# Patient Record
Sex: Female | Born: 1955 | Race: White | Hispanic: No | Smoking: Former smoker
Health system: Southern US, Community
[De-identification: ages and names within clinical notes are randomized; demographics above are authoritative.]

## PROBLEM LIST (undated history)

## (undated) DIAGNOSIS — F419 Anxiety disorder, unspecified: Secondary | ICD-10-CM

## (undated) DIAGNOSIS — M1990 Unspecified osteoarthritis, unspecified site: Secondary | ICD-10-CM

## (undated) HISTORY — PX: BREAST EXCISIONAL BIOPSY: SUR124

## (undated) HISTORY — DX: Anxiety disorder, unspecified: F41.9

---

## 1979-06-03 HISTORY — PX: BREAST BIOPSY: SHX20

## 2001-09-23 ENCOUNTER — Other Ambulatory Visit: Admission: RE | Admit: 2001-09-23 | Discharge: 2001-09-23 | Payer: Self-pay | Admitting: Internal Medicine

## 2002-10-07 HISTORY — PX: ABDOMINAL HYSTERECTOMY: SHX81

## 2004-04-23 ENCOUNTER — Other Ambulatory Visit: Payer: Self-pay

## 2004-07-14 ENCOUNTER — Ambulatory Visit: Payer: Self-pay

## 2005-05-01 ENCOUNTER — Ambulatory Visit: Payer: Self-pay

## 2005-05-06 ENCOUNTER — Ambulatory Visit: Payer: Self-pay | Admitting: Internal Medicine

## 2005-07-20 ENCOUNTER — Ambulatory Visit: Payer: Self-pay

## 2005-07-31 ENCOUNTER — Ambulatory Visit: Payer: Self-pay

## 2005-10-28 ENCOUNTER — Emergency Department: Payer: Self-pay | Admitting: Internal Medicine

## 2005-11-03 ENCOUNTER — Ambulatory Visit: Payer: Self-pay | Admitting: Urology

## 2006-01-15 ENCOUNTER — Ambulatory Visit: Payer: Self-pay | Admitting: General Surgery

## 2006-05-16 ENCOUNTER — Ambulatory Visit: Payer: Self-pay

## 2006-05-29 ENCOUNTER — Ambulatory Visit: Payer: Self-pay | Admitting: Urology

## 2006-06-20 ENCOUNTER — Ambulatory Visit: Payer: Self-pay | Admitting: Unknown Physician Specialty

## 2006-06-29 ENCOUNTER — Ambulatory Visit: Payer: Self-pay | Admitting: Unknown Physician Specialty

## 2006-07-19 ENCOUNTER — Ambulatory Visit: Payer: Self-pay | Admitting: Unknown Physician Specialty

## 2006-07-24 ENCOUNTER — Ambulatory Visit: Payer: Self-pay | Admitting: General Surgery

## 2006-07-30 ENCOUNTER — Emergency Department: Payer: Self-pay | Admitting: Unknown Physician Specialty

## 2006-08-03 ENCOUNTER — Ambulatory Visit: Payer: Self-pay | Admitting: Unknown Physician Specialty

## 2006-12-14 ENCOUNTER — Ambulatory Visit: Payer: Self-pay | Admitting: Unknown Physician Specialty

## 2007-04-23 ENCOUNTER — Emergency Department: Payer: Self-pay | Admitting: Emergency Medicine

## 2007-07-19 DIAGNOSIS — N209 Urinary calculus, unspecified: Secondary | ICD-10-CM | POA: Insufficient documentation

## 2007-09-03 ENCOUNTER — Ambulatory Visit: Payer: Self-pay | Admitting: General Surgery

## 2007-09-10 ENCOUNTER — Ambulatory Visit: Payer: Self-pay | Admitting: Urology

## 2008-04-10 ENCOUNTER — Ambulatory Visit: Payer: Self-pay | Admitting: Unknown Physician Specialty

## 2008-09-04 ENCOUNTER — Ambulatory Visit: Payer: Self-pay | Admitting: General Surgery

## 2009-09-07 ENCOUNTER — Ambulatory Visit: Payer: Self-pay | Admitting: General Surgery

## 2010-06-28 LAB — HM PAP SMEAR: HM PAP: NEGATIVE

## 2010-06-29 ENCOUNTER — Ambulatory Visit: Payer: Self-pay | Admitting: Family Medicine

## 2010-11-16 ENCOUNTER — Ambulatory Visit: Payer: Self-pay

## 2011-07-13 ENCOUNTER — Ambulatory Visit: Payer: Self-pay | Admitting: Family Medicine

## 2011-07-27 LAB — LIPID PANEL
CHOLESTEROL: 215 mg/dL — AB (ref 0–200)
HDL: 72 mg/dL — AB (ref 35–70)
LDL Cholesterol: 127 mg/dL
Triglycerides: 80 mg/dL (ref 40–160)

## 2012-07-17 ENCOUNTER — Ambulatory Visit: Payer: Self-pay | Admitting: Family Medicine

## 2013-07-10 ENCOUNTER — Ambulatory Visit: Payer: Self-pay | Admitting: Family Medicine

## 2013-12-02 ENCOUNTER — Other Ambulatory Visit: Payer: Self-pay | Admitting: Family Medicine

## 2013-12-02 LAB — CBC WITH DIFFERENTIAL/PLATELET
Basophil #: 0.1 10*3/uL (ref 0.0–0.1)
Basophil %: 1.1 %
EOS ABS: 0.6 10*3/uL (ref 0.0–0.7)
Eosinophil %: 7 %
HCT: 40.5 % (ref 35.0–47.0)
HGB: 13.6 g/dL (ref 12.0–16.0)
Lymphocyte #: 2.3 10*3/uL (ref 1.0–3.6)
Lymphocyte %: 28.1 %
MCH: 32.2 pg (ref 26.0–34.0)
MCHC: 33.5 g/dL (ref 32.0–36.0)
MCV: 96 fL (ref 80–100)
MONOS PCT: 6.2 %
Monocyte #: 0.5 x10 3/mm (ref 0.2–0.9)
NEUTROS ABS: 4.8 10*3/uL (ref 1.4–6.5)
Neutrophil %: 57.6 %
Platelet: 377 10*3/uL (ref 150–440)
RBC: 4.22 10*6/uL (ref 3.80–5.20)
RDW: 13.2 % (ref 11.5–14.5)
WBC: 8.3 10*3/uL (ref 3.6–11.0)

## 2013-12-02 LAB — LIPID PANEL
Cholesterol: 200 mg/dL (ref 0–200)
HDL: 72 mg/dL — AB (ref 40–60)
Ldl Cholesterol, Calc: 113 mg/dL — ABNORMAL HIGH (ref 0–100)
Triglycerides: 76 mg/dL (ref 0–200)
VLDL CHOLESTEROL, CALC: 15 mg/dL (ref 5–40)

## 2013-12-02 LAB — COMPREHENSIVE METABOLIC PANEL
ALK PHOS: 94 U/L
AST: 26 U/L (ref 15–37)
Albumin: 3.9 g/dL (ref 3.4–5.0)
Anion Gap: 6 — ABNORMAL LOW (ref 7–16)
BILIRUBIN TOTAL: 0.5 mg/dL (ref 0.2–1.0)
BUN: 15 mg/dL (ref 7–18)
Calcium, Total: 9 mg/dL (ref 8.5–10.1)
Chloride: 102 mmol/L (ref 98–107)
Co2: 27 mmol/L (ref 21–32)
Creatinine: 0.66 mg/dL (ref 0.60–1.30)
Glucose: 83 mg/dL (ref 65–99)
Osmolality: 270 (ref 275–301)
Potassium: 4 mmol/L (ref 3.5–5.1)
SGPT (ALT): 27 U/L (ref 12–78)
Sodium: 135 mmol/L — ABNORMAL LOW (ref 136–145)
TOTAL PROTEIN: 7.7 g/dL (ref 6.4–8.2)

## 2014-07-21 ENCOUNTER — Ambulatory Visit: Payer: Self-pay | Admitting: Family Medicine

## 2014-08-05 ENCOUNTER — Ambulatory Visit: Payer: Self-pay | Admitting: Family Medicine

## 2014-08-05 LAB — HM MAMMOGRAPHY

## 2014-09-12 ENCOUNTER — Emergency Department: Payer: Self-pay | Admitting: Emergency Medicine

## 2014-09-12 LAB — CBC WITH DIFFERENTIAL/PLATELET
BASOS PCT: 1 %
Basophil #: 0.1 10*3/uL (ref 0.0–0.1)
EOS PCT: 4 %
Eosinophil #: 0.4 10*3/uL (ref 0.0–0.7)
HCT: 43.5 % (ref 35.0–47.0)
HGB: 14 g/dL (ref 12.0–16.0)
Lymphocyte #: 2.9 10*3/uL (ref 1.0–3.6)
Lymphocyte %: 27 %
MCH: 31.9 pg (ref 26.0–34.0)
MCHC: 32.2 g/dL (ref 32.0–36.0)
MCV: 99 fL (ref 80–100)
Monocyte #: 0.6 x10 3/mm (ref 0.2–0.9)
Monocyte %: 5.5 %
Neutrophil #: 6.6 10*3/uL — ABNORMAL HIGH (ref 1.4–6.5)
Neutrophil %: 62.5 %
Platelet: 406 10*3/uL (ref 150–440)
RBC: 4.39 10*6/uL (ref 3.80–5.20)
RDW: 13.1 % (ref 11.5–14.5)
WBC: 10.7 10*3/uL (ref 3.6–11.0)

## 2014-09-12 LAB — COMPREHENSIVE METABOLIC PANEL
ALBUMIN: 3.7 g/dL (ref 3.4–5.0)
ALK PHOS: 108 U/L
AST: 24 U/L (ref 15–37)
Anion Gap: 8 (ref 7–16)
BUN: 7 mg/dL (ref 7–18)
Bilirubin,Total: 0.3 mg/dL (ref 0.2–1.0)
Calcium, Total: 9 mg/dL (ref 8.5–10.1)
Chloride: 107 mmol/L (ref 98–107)
Co2: 25 mmol/L (ref 21–32)
Creatinine: 0.66 mg/dL (ref 0.60–1.30)
EGFR (African American): >60
Glucose: 100 mg/dL — ABNORMAL HIGH (ref 65–99)
Osmolality: 277 (ref 275–301)
POTASSIUM: 3.7 mmol/L (ref 3.5–5.1)
SGPT (ALT): 26 U/L
SODIUM: 140 mmol/L (ref 136–145)
Total Protein: 7.9 g/dL (ref 6.4–8.2)

## 2014-09-12 LAB — URINALYSIS, COMPLETE
Bacteria: NONE SEEN
Bilirubin,UR: NEGATIVE
GLUCOSE, UR: NEGATIVE mg/dL (ref 0–75)
Ketone: NEGATIVE
NITRITE: NEGATIVE
PROTEIN: NEGATIVE
Ph: 6 (ref 4.5–8.0)
RBC,UR: 1 /HPF (ref 0–5)
Specific Gravity: 1.001 (ref 1.003–1.030)
Squamous Epithelial: 1

## 2014-09-12 LAB — LIPASE, BLOOD: LIPASE: 167 U/L (ref 73–393)

## 2014-09-12 LAB — TROPONIN I: Troponin-I: <0.02 ng/mL

## 2014-11-06 LAB — BASIC METABOLIC PANEL
BUN: 12 mg/dL (ref 4–21)
CREATININE: 0.7 mg/dL (ref 0.5–1.1)
Glucose: 90 mg/dL
Potassium: 4.2 mmol/L (ref 3.4–5.3)
Sodium: 136 mmol/L — AB (ref 137–147)

## 2014-11-06 LAB — CBC AND DIFFERENTIAL
HCT: 42 % (ref 36–46)
Hemoglobin: 13.6 g/dL (ref 12.0–16.0)
PLATELETS: 435 10*3/uL — AB (ref 150–399)
WBC: 8.2 10*3/mL

## 2014-11-06 LAB — HEPATIC FUNCTION PANEL
ALT: 15 U/L (ref 7–35)
AST: 19 U/L (ref 13–35)

## 2014-11-06 LAB — TSH: TSH: 1.73 u[IU]/mL (ref 0.41–5.90)

## 2014-11-13 ENCOUNTER — Ambulatory Visit: Payer: Self-pay | Admitting: Gastroenterology

## 2014-11-13 LAB — HM COLONOSCOPY

## 2015-04-24 ENCOUNTER — Other Ambulatory Visit: Payer: Self-pay | Admitting: Family Medicine

## 2015-04-24 DIAGNOSIS — F419 Anxiety disorder, unspecified: Secondary | ICD-10-CM

## 2015-04-26 DIAGNOSIS — F419 Anxiety disorder, unspecified: Secondary | ICD-10-CM | POA: Insufficient documentation

## 2015-04-26 NOTE — Telephone Encounter (Signed)
Last OV 01/2015 

## 2015-05-21 ENCOUNTER — Telehealth: Payer: Self-pay | Admitting: Family Medicine

## 2015-05-21 ENCOUNTER — Other Ambulatory Visit: Payer: Self-pay | Admitting: Family Medicine

## 2015-05-21 DIAGNOSIS — F419 Anxiety disorder, unspecified: Secondary | ICD-10-CM

## 2015-05-21 NOTE — Telephone Encounter (Signed)
Pt is a Dr. Santiago Bur pt but needs a refill on her medicine , [facility] CB# [phone] CC  ALPRAZolam (XANAX) 0.5 MG tablet

## 2015-05-21 NOTE — Telephone Encounter (Signed)
[facility], [facility] has 1 refill left.

## 2015-05-21 NOTE — Telephone Encounter (Signed)
Spoke with pt regarding wanting a new prescription refill for xanax, per previus phone call notes, pharmacy has stated that pt has one more refill.  Pt notified of the prescription refill already in place and was advised to call if any questions or concerns.

## 2015-06-04 ENCOUNTER — Other Ambulatory Visit: Payer: Self-pay | Admitting: Family Medicine

## 2015-06-04 DIAGNOSIS — F419 Anxiety disorder, unspecified: Secondary | ICD-10-CM

## 2015-06-04 MED ORDER — FLUOXETINE HCL 40 MG PO CAPS: 40.0000 mg | ORAL_CAPSULE | Freq: Every day | ORAL | Status: DC

## 2015-06-04 NOTE — Telephone Encounter (Signed)
Pt contacted office for refill request on the following medications: Prozac 40 mg to [facility].  This was a Debbie pt. Pt stated the pharmacy sent a refill request to [place] and it was denied. I didn't see the request. (maybe b/c they might have tried to send it to [facility]) Pt stated when she had an OV with Dr. Elease Hashimoto on 02/11/15 the medication was discussed and she doesn't know why we are only sending in refills one month at a time. Pt stated that when she got her other RX she was told there was a note that she needs an OV. Pt was wondering why she needs an OV when she was in for one in May and the meds were discussed. Please advise. Thanks TNP

## 2015-06-17 ENCOUNTER — Other Ambulatory Visit: Payer: Self-pay | Admitting: Family Medicine

## 2015-06-17 DIAGNOSIS — F419 Anxiety disorder, unspecified: Secondary | ICD-10-CM

## 2015-06-17 MED ORDER — ALPRAZOLAM 0.5 MG PO TABS: 0.5000 mg | ORAL_TABLET | Freq: Three times a day (TID) | ORAL | Status: DC | PRN

## 2015-06-17 NOTE — Telephone Encounter (Signed)
Ok to phone in rx. Thanks.  

## 2015-06-17 NOTE — Telephone Encounter (Signed)
Pt contacted office for refill request on the following medications: ALPRAZolam (XANAX) 0.5 MG tablet to [facility]. Pt asked for more than one refill. Pt scheduled her CPE for 11/10/15. Last OV 02/11/15. Thanks TNP

## 2015-07-27 ENCOUNTER — Telehealth: Payer: Self-pay

## 2015-07-27 DIAGNOSIS — R921 Mammographic calcification found on diagnostic imaging of breast: Secondary | ICD-10-CM | POA: Insufficient documentation

## 2015-07-27 NOTE — Telephone Encounter (Signed)
Mammo from 08/2014 found breast calcifications in left breast. Was supposed to have FU diagnostic bilateral mammo in 6 months. Please sign. Allene DillonEmily Drozdowski, CMA

## 2015-07-27 NOTE — Telephone Encounter (Signed)
Pt states that she needs mammogram set up and they told her she needs order from us because her last one was not normal and she forgot to follow up on this. Please review-aa

## 2015-07-28 ENCOUNTER — Telehealth: Payer: Self-pay

## 2015-07-28 NOTE — Telephone Encounter (Signed)
Pt advised of appt scheduled for 08/13/2015 at 9:20 am. sd

## 2015-07-28 NOTE — Telephone Encounter (Signed)
Dr. Elease HashimotoMaloney have you signed the order for the diagnostic mammo. Pt called back requesting diagnostic mammogram, per pt to schedule around lunch time or late in afternoon. sd

## 2015-07-28 NOTE — Telephone Encounter (Signed)
Patient is returning call. Thanks. °

## 2015-07-28 NOTE — Telephone Encounter (Signed)
Ok to put in order for diagnostic mammo, but do think I already signed it yesterday.

## 2015-08-09 ENCOUNTER — Other Ambulatory Visit: Payer: Self-pay | Admitting: Family Medicine

## 2015-08-09 DIAGNOSIS — F419 Anxiety disorder, unspecified: Secondary | ICD-10-CM

## 2015-08-09 MED ORDER — ALPRAZOLAM 0.5 MG PO TABS: 0.5000 mg | ORAL_TABLET | Freq: Three times a day (TID) | ORAL | Status: DC | PRN

## 2015-08-09 NOTE — Telephone Encounter (Signed)
Printed, please fax or call in to pharmacy. Thank you.   

## 2015-08-09 NOTE — Telephone Encounter (Signed)
Pt needs refill on her ALPRAZolam Prudy Feeler(XANAX) 0.5 MG tablet   [facility] [place]  Call back is [phone]  Thanks Barth Kirkseri

## 2015-08-13 ENCOUNTER — Ambulatory Visit
Admission: RE | Admit: 2015-08-13 | Discharge: 2015-08-13 | Disposition: A | Discharge status: Home / Self Care | Payer: BLUE CROSS/BLUE SHIELD | Source: Ambulatory Visit | Attending: Family Medicine | Admitting: Family Medicine

## 2015-08-13 ENCOUNTER — Other Ambulatory Visit: Payer: Self-pay | Admitting: Family Medicine

## 2015-08-13 DIAGNOSIS — R921 Mammographic calcification found on diagnostic imaging of breast: Secondary | ICD-10-CM

## 2015-10-25 DIAGNOSIS — F329 Major depressive disorder, single episode, unspecified: Secondary | ICD-10-CM | POA: Insufficient documentation

## 2015-10-25 DIAGNOSIS — R319 Hematuria, unspecified: Secondary | ICD-10-CM | POA: Insufficient documentation

## 2015-10-25 DIAGNOSIS — F32A Depression, unspecified: Secondary | ICD-10-CM | POA: Insufficient documentation

## 2015-10-25 DIAGNOSIS — J309 Allergic rhinitis, unspecified: Secondary | ICD-10-CM | POA: Insufficient documentation

## 2015-10-25 DIAGNOSIS — J45909 Unspecified asthma, uncomplicated: Secondary | ICD-10-CM | POA: Insufficient documentation

## 2015-10-25 DIAGNOSIS — E559 Vitamin D deficiency, unspecified: Secondary | ICD-10-CM | POA: Insufficient documentation

## 2015-11-10 ENCOUNTER — Encounter: Payer: Self-pay | Admitting: Family Medicine

## 2015-11-17 ENCOUNTER — Other Ambulatory Visit: Payer: Self-pay | Admitting: Family Medicine

## 2015-11-17 DIAGNOSIS — F419 Anxiety disorder, unspecified: Secondary | ICD-10-CM

## 2015-11-17 MED ORDER — ALPRAZOLAM 0.5 MG PO TABS: 0.5000 mg | ORAL_TABLET | Freq: Three times a day (TID) | ORAL | Status: DC | PRN

## 2015-11-17 NOTE — Telephone Encounter (Signed)
Printed, please fax or call in to pharmacy. Also needs ov.  Thank you.

## 2015-11-17 NOTE — Telephone Encounter (Signed)
02/11/15 last ov

## 2015-11-17 NOTE — Telephone Encounter (Signed)
Pt contacted office for refill request on the following medications:  ALPRAZolam (XANAX) 0.5 MG tablet.  [facility] [place]  CB#[phone]/MW °

## 2015-11-17 NOTE — Telephone Encounter (Signed)
[facility] Patient is aware. Patient states she will call back tomorrow to schedule a follow up.

## 2016-01-05 ENCOUNTER — Other Ambulatory Visit: Payer: Self-pay | Admitting: Family Medicine

## 2016-01-05 DIAGNOSIS — F419 Anxiety disorder, unspecified: Secondary | ICD-10-CM

## 2016-01-05 MED ORDER — ALPRAZOLAM 0.5 MG PO TABS: 0.5000 mg | ORAL_TABLET | Freq: Three times a day (TID) | ORAL | Status: DC | PRN

## 2016-01-05 NOTE — Telephone Encounter (Signed)
Has CPE scheduled for 02/18/2016. Last refill 11/17/2015. Allene DillonEmily Drozdowski, CMA

## 2016-01-05 NOTE — Telephone Encounter (Signed)
Pt needs refill on her ALPRAZolam Prudy Feeler(XANAX) 0.5 MG tablet  [facility] [place]   Her call back is [phone]  [employer]anks[employer]eri

## 2016-01-05 NOTE — Telephone Encounter (Signed)
[facility].Attempted to contact patient. No answer and voicemail was full

## 2016-01-05 NOTE — Telephone Encounter (Signed)
Printed, please fax or call in to pharmacy. Thank you.   

## 2016-01-27 ENCOUNTER — Ambulatory Visit: Payer: Self-pay | Admitting: Physician Assistant

## 2016-01-27 ENCOUNTER — Telehealth: Payer: Self-pay | Admitting: Family Medicine

## 2016-01-27 DIAGNOSIS — K219 Gastro-esophageal reflux disease without esophagitis: Secondary | ICD-10-CM

## 2016-01-27 MED ORDER — ESOMEPRAZOLE MAGNESIUM 20 MG PO CPDR: 20.0000 mg | DELAYED_RELEASE_CAPSULE | Freq: Two times a day (BID) | ORAL | Status: DC | PRN

## 2016-01-27 NOTE — Telephone Encounter (Signed)
Is this okay to do.  Pt has never been seen for reflux, at least recently.  (I don't see it in her problem list in Epic or Allscripts.)  Thanks,   -Vernona RiegerLaura

## 2016-01-27 NOTE — Telephone Encounter (Signed)
Pt needs RX for nexium.  She has been purchasing them OTC.  Patient said she takes them for reflux. gerd  [facility] [place] [facility] she ask for a rx so it would not be as costly.  Pt 's call back is [phone]  [facility]

## 2016-01-27 NOTE — Telephone Encounter (Signed)
Pt was seen for this in February, 2016. Was referred to GI and started on PPI for abdominal pain. Pt has been buying this OTC, which is becoming pricey. Per verbal order from Dr. Elease HashimotoMaloney, sent in rx to pharmacy. Allene DillonEmily Drozdowski, CMA

## 2016-01-27 NOTE — Telephone Encounter (Signed)
Needs ov if we have not treated her for this before I can send in rx to document need. Ok to put in same day if needed.  Thanks.

## 2016-02-02 ENCOUNTER — Other Ambulatory Visit: Payer: Self-pay | Admitting: Family Medicine

## 2016-02-02 DIAGNOSIS — F419 Anxiety disorder, unspecified: Secondary | ICD-10-CM

## 2016-02-02 MED ORDER — ALPRAZOLAM 0.5 MG PO TABS: 0.5000 mg | ORAL_TABLET | Freq: Three times a day (TID) | ORAL | Status: DC | PRN

## 2016-02-02 NOTE — Telephone Encounter (Signed)
Pt contacted office for refill request on the following medications:  ALPRAZolam (XANAX) 0.5 MG tablet.  Pt is requesting a 6 month supply.  Pt has her CPE on 02/17/2014. [facility] [place]  CB#[phone]/MW

## 2016-02-02 NOTE — Telephone Encounter (Signed)
Printed, please fax or call in to pharmacy. Thank you.   

## 2016-02-02 NOTE — Telephone Encounter (Signed)
Pt needs to be seen before she gets a six month refill.  She has not been here since 01/2015 and has had one "No Show" with Antony ContrasJenni.   Thanks,   -Vernona RiegerLaura

## 2016-02-18 ENCOUNTER — Encounter: Payer: Self-pay | Admitting: Family Medicine

## 2016-02-18 ENCOUNTER — Ambulatory Visit (INDEPENDENT_AMBULATORY_CARE_PROVIDER_SITE_OTHER): Payer: BLUE CROSS/BLUE SHIELD | Admitting: Family Medicine

## 2016-02-18 VITALS — BP 120/70 | HR 88 | Temp 98.2°F | Resp 16 | Ht 67.0 in | Wt 222.0 lb

## 2016-02-18 DIAGNOSIS — F32A Depression, unspecified: Secondary | ICD-10-CM

## 2016-02-18 DIAGNOSIS — Z0000 Encounter for general adult medical examination without abnormal findings: Secondary | ICD-10-CM

## 2016-02-18 DIAGNOSIS — F329 Major depressive disorder, single episode, unspecified: Secondary | ICD-10-CM

## 2016-02-18 DIAGNOSIS — K219 Gastro-esophageal reflux disease without esophagitis: Secondary | ICD-10-CM | POA: Diagnosis not present

## 2016-02-18 DIAGNOSIS — R319 Hematuria, unspecified: Secondary | ICD-10-CM

## 2016-02-18 DIAGNOSIS — R5383 Other fatigue: Secondary | ICD-10-CM | POA: Diagnosis not present

## 2016-02-18 DIAGNOSIS — R635 Abnormal weight gain: Secondary | ICD-10-CM

## 2016-02-18 DIAGNOSIS — F419 Anxiety disorder, unspecified: Secondary | ICD-10-CM | POA: Diagnosis not present

## 2016-02-18 LAB — POCT URINALYSIS DIPSTICK
Bilirubin, UA: NEGATIVE
Glucose, UA: NEGATIVE
Ketones, UA: NEGATIVE
Nitrite, UA: NEGATIVE
PROTEIN UA: NEGATIVE
SPEC GRAV UA: 1.015
UROBILINOGEN UA: 0.2
pH, UA: 6

## 2016-02-18 MED ORDER — ALPRAZOLAM 0.5 MG PO TABS: 0.5000 mg | ORAL_TABLET | Freq: Three times a day (TID) | ORAL | Status: DC | PRN

## 2016-02-18 NOTE — Progress Notes (Signed)
Patient ID: Marie Foley, female   DOB: Dec 13, 1955, 60 y.o.   MRN: [id number]       Patient: Marie Foley, Female    DOB: 04-17-1956, 60 y.o.   MRN: [id number] Visit Date: 02/18/2016  Today's Provider: Margarita Rana, MD   Chief Complaint  Patient presents with  . Annual Exam   Subjective:    Annual physical exam Marie Foley is a 60 y.o. female who presents today for health maintenance and complete physical. She feels fairly well. She reports exercising none. She reports she is sleeping well.  11/06/14 CPE 06/28/10 Pap-neg 08/13/15 Mammogram-BI-RADS 3 11/13/14 Colonoscopy-polyps; tubular adenoma recheck in 5 years, Dr. Allen Norris -----------------------------------------------------------------  Fatigue: Patient complains of fatigue. Symptoms began several months ago. Sentinal symptom the patient feels fatigue began with: none. Symptoms of her fatigue have been feelings of depression. Patient describes the following psychologic symptoms: depression.  Patient denies fever. Symptoms have stabilized. Severity has been struggles to carry out day to day responsibilities.. Previous visits for this problem: none.    Review of Systems  Constitutional: Positive for fatigue.  HENT: Negative.   Eyes: Negative.   Respiratory: Negative.   Cardiovascular: Negative.   Gastrointestinal: Positive for abdominal distention.  Endocrine: Negative.   Genitourinary: Negative.   Musculoskeletal: Negative.   Skin: Negative.   Allergic/Immunologic: Negative.   Neurological: Negative.   Hematological: Negative.   Psychiatric/Behavioral: Negative.     Social History      She  reports that she quit smoking about 17 years ago. She has never used smokeless tobacco. She reports that she drinks alcohol. She reports that she does not use illicit drugs.       Social History   Social History  . Marital Status: Widowed    Spouse Name: N/A  . Number of Children: N/A  . Years of Education: N/A    Social History Main Topics  . Smoking status: Former Smoker    Quit date: 10/01/1998  . Smokeless tobacco: Never Used  . Alcohol Use: Yes     Comment: ocassional  . Drug Use: No  . Sexual Activity: Not Asked   Other Topics Concern  . None   Social History Narrative    History reviewed. No pertinent past medical history.   Patient Active Problem List   Diagnosis Date Noted  . Allergic rhinitis 10/25/2015  . Clinical depression 10/25/2015  . Blood in the urine 10/25/2015  . RAD (reactive airway disease) 10/25/2015  . Avitaminosis D 10/25/2015  . Calcification of left breast 07/27/2015  . Anxiety disorder 04/26/2015  . Urinary calculus 07/19/2007    Past Surgical History  Procedure Laterality Date  . Breast biopsy Left 1980's    EXCISIONAL BX - NEG  . Abdominal hysterectomy  10/07/2002    Dr. Laurey Morale; due to hemorrhaging    Family History        Family Status  Relation Status Death Age  . Mother Deceased 44    ectopic pregnancy  . Father Deceased 27  . Sister Alive   . Brother Alive   . Sister Alive   . Sister Alive   . Brother Alive         Her family history includes Alzheimer's disease in her father; Breast cancer in her paternal grandmother; Depression in her father, sister, sister, and sister; Early death in her mother.    Allergies  Allergen Reactions  . Diclofenac-Misoprostol     Previous Medications   ALPRAZOLAM (XANAX) 0.5 MG  TABLET    Take 1 tablet (0.5 mg total) by mouth every 8 (eight) hours as needed.   ESOMEPRAZOLE (NEXIUM) 20 MG CAPSULE    Take 1 capsule (20 mg total) by mouth 2 (two) times daily as needed.   FLUOXETINE (PROZAC) 40 MG CAPSULE    Take 1 capsule (40 mg total) by mouth daily.   FLUTICASONE (FLONASE) 50 MCG/ACT NASAL SPRAY    Place 2 sprays into the nose as needed.    FLUTICASONE-SALMETEROL (ADVAIR DISKUS) 250-50 MCG/DOSE AEPB    Inhale 1 puff into the lungs as needed.     Patient Care Team: Margarita Rana, MD as PCP -  General (Family Medicine)     Objective:   Vitals: BP 120/70 mmHg  Pulse 88  Temp(Src) 98.2 F (36.8 C) (Oral)  Resp 16  Ht '5\' 7"'  (1.702 m)  Wt 222 lb (100.699 kg)  BMI 34.76 kg/m2   Physical Exam  Constitutional: She is oriented to person, place, and time. She appears well-developed and well-nourished.  HENT:  Head: Normocephalic and atraumatic.  Right Ear: Tympanic membrane, external ear and ear canal normal.  Left Ear: Tympanic membrane, external ear and ear canal normal.  Nose: Nose normal.  Mouth/Throat: Uvula is midline, oropharynx is clear and moist and mucous membranes are normal.  Eyes: Conjunctivae, EOM and lids are normal. Pupils are equal, round, and reactive to light.  Neck: Trachea normal and normal range of motion. Neck supple. Carotid bruit is not present. No thyroid mass and no thyromegaly present.  Cardiovascular: Normal rate, regular rhythm and normal heart sounds.   Pulmonary/Chest: Effort normal and breath sounds normal.  Abdominal: Soft. Normal appearance and bowel sounds are normal. There is no hepatosplenomegaly. There is no tenderness.  Genitourinary: No breast swelling, tenderness or discharge.  Musculoskeletal: Normal range of motion.  Lymphadenopathy:    She has no cervical adenopathy.    She has no axillary adenopathy.  Neurological: She is alert and oriented to person, place, and time. She has normal strength. No cranial nerve deficit.  Skin: Skin is warm, dry and intact.  Psychiatric: She has a normal mood and affect. Her speech is normal and behavior is normal. Judgment and thought content normal. Cognition and memory are normal.     Depression Screen PHQ 2/9 Scores 02/18/2016  PHQ - 2 Score 2  PHQ- 9 Score 6     Assessment & Plan:     Routine Health Maintenance and Physical Exam  Exercise Activities and Dietary recommendations Goals    . Exercise 150 minutes per week (moderate activity)       Immunization History  Administered  Date(s) Administered  . Tdap 10/15/2013       1. Annual physical exam Stable. Patient advised to continue eating healthy and exercise daily. - POCT urinalysis dipstick  2. Other fatigue New problem. Worsening. F/U pending lab report. - CBC with Differential/Platelet - Comprehensive metabolic panel  3. Anxiety disorder, unspecified anxiety disorder type Stable.  4. Clinical depression Stable.  5. Abnormal weight gain New problem. F/U pending lab report. Recommend increase exercise. Has really gotten out of the habit since the death of her spouse 4 years ago.  Met Tawanna Sat today also and will follow up with her in 4 months.   - Lipid Panel With LDL/HDL Ratio - TSH  6. Blood in the urine F/U pending lab report. - Urine Microscopic   Patient seen and examined by Dr. Jerrell Belfast, and note scribed by Madison Hickman  Cleaster Corin, CMA.  I have reviewed the document for accuracy and completeness and I agree with above. Jerrell Belfast, MD   Margarita Rana, MD   --------------------------------------------------------------------

## 2016-02-19 LAB — COMPREHENSIVE METABOLIC PANEL
A/G RATIO: 1.9 (ref 1.2–2.2)
ALBUMIN: 4.6 g/dL (ref 3.5–5.5)
ALT: 26 IU/L (ref 0–32)
AST: 24 IU/L (ref 0–40)
Alkaline Phosphatase: 116 IU/L (ref 39–117)
BILIRUBIN TOTAL: 0.6 mg/dL (ref 0.0–1.2)
BUN / CREAT RATIO: 19 (ref 9–23)
BUN: 13 mg/dL (ref 6–24)
CO2: 25 mmol/L (ref 18–29)
Calcium: 10 mg/dL (ref 8.7–10.2)
Chloride: 96 mmol/L (ref 96–106)
Creatinine, Ser: 0.68 mg/dL (ref 0.57–1.00)
GFR calc non Af Amer: 96 mL/min/{1.73_m2} (ref >59)
GFR, EST AFRICAN AMERICAN: 111 mL/min/{1.73_m2} (ref >59)
GLOBULIN, TOTAL: 2.4 g/dL (ref 1.5–4.5)
Glucose: 88 mg/dL (ref 65–99)
POTASSIUM: 4.9 mmol/L (ref 3.5–5.2)
SODIUM: 136 mmol/L (ref 134–144)
TOTAL PROTEIN: 7 g/dL (ref 6.0–8.5)

## 2016-02-19 LAB — URINALYSIS, MICROSCOPIC ONLY
Casts: NONE SEEN /lpf
WBC, UA: >30 /hpf — AB (ref 0–?)

## 2016-02-19 LAB — CBC WITH DIFFERENTIAL/PLATELET
BASOS ABS: 0.1 10*3/uL (ref 0.0–0.2)
Basos: 1 %
EOS (ABSOLUTE): 0.7 10*3/uL — ABNORMAL HIGH (ref 0.0–0.4)
Eos: 8 %
HEMOGLOBIN: 13.6 g/dL (ref 11.1–15.9)
Hematocrit: 40.5 % (ref 34.0–46.6)
IMMATURE GRANS (ABS): 0 10*3/uL (ref 0.0–0.1)
IMMATURE GRANULOCYTES: 0 %
LYMPHS: 29 %
Lymphocytes Absolute: 2.7 10*3/uL (ref 0.7–3.1)
MCH: 31.3 pg (ref 26.6–33.0)
MCHC: 33.6 g/dL (ref 31.5–35.7)
MCV: 93 fL (ref 79–97)
MONOCYTES: 8 %
Monocytes Absolute: 0.7 10*3/uL (ref 0.1–0.9)
NEUTROS PCT: 54 %
Neutrophils Absolute: 5.1 10*3/uL (ref 1.4–7.0)
Platelets: 428 10*3/uL — ABNORMAL HIGH (ref 150–379)
RBC: 4.35 x10E6/uL (ref 3.77–5.28)
RDW: 13.7 % (ref 12.3–15.4)
WBC: 9.2 10*3/uL (ref 3.4–10.8)

## 2016-02-19 LAB — LIPID PANEL WITH LDL/HDL RATIO
Cholesterol, Total: 226 mg/dL — ABNORMAL HIGH (ref 100–199)
HDL: 77 mg/dL (ref >39)
LDL CALC: 133 mg/dL — AB (ref 0–99)
LDL/HDL RATIO: 1.7 ratio (ref 0.0–3.2)
Triglycerides: 79 mg/dL (ref 0–149)
VLDL Cholesterol Cal: 16 mg/dL (ref 5–40)

## 2016-02-19 LAB — TSH: TSH: 1.68 u[IU]/mL (ref 0.450–4.500)

## 2016-02-21 ENCOUNTER — Telehealth: Payer: Self-pay

## 2016-02-21 MED ORDER — NITROFURANTOIN MONOHYD MACRO 100 MG PO CAPS: 100.0000 mg | ORAL_CAPSULE | Freq: Two times a day (BID) | ORAL | Status: DC

## 2016-02-21 NOTE — Telephone Encounter (Signed)
Patient advised. sd 

## 2016-02-21 NOTE — Telephone Encounter (Signed)
Pt advised; she reports having "Pain in her bladder area"  She says it has been going on for a while now.  If you recommend an [facility] [place].   Thanks,   -Vernona RiegerLaura

## 2016-02-21 NOTE — Telephone Encounter (Signed)
-----   Message from Lorie PhenixNancy Maloney, MD sent at 02/20/2016 11:25 AM EDT ----- Labs stable. Cholesterol is elevated but does have high good cholesterol.   Thyroid ok.   Urine with some inflammation, but no blood or infection. Please see if having any urinary or vaginal issues.  Thanks.

## 2016-02-21 NOTE — Telephone Encounter (Signed)
Sent in rx.  Thanks.

## 2016-03-31 ENCOUNTER — Telehealth: Payer: Self-pay | Admitting: Emergency Medicine

## 2016-03-31 DIAGNOSIS — J069 Acute upper respiratory infection, unspecified: Secondary | ICD-10-CM

## 2016-03-31 MED ORDER — AZITHROMYCIN 250 MG PO TABS: ORAL_TABLET | ORAL | Status: DC

## 2016-03-31 NOTE — Telephone Encounter (Signed)
Pt advised-aa 

## 2016-03-31 NOTE — Telephone Encounter (Signed)
Will give zpak. If no improvement she must be seen.

## 2016-03-31 NOTE — Telephone Encounter (Signed)
Pt reports that Dr. Elease HashimotoMaloney used to call in an abx for her with out being seen for a cough and congestion that she gets every year. She reports that she has a cough and congestion, denies fever increased shortness of breath. I informed her that you would more than likely want to see her but she was addiment about me asking another provider because she had meet you in the office when she saw her last. Please advise.   [facility] [place]

## 2016-04-17 ENCOUNTER — Other Ambulatory Visit: Payer: Self-pay | Admitting: Physician Assistant

## 2016-04-17 DIAGNOSIS — F419 Anxiety disorder, unspecified: Secondary | ICD-10-CM

## 2016-04-17 MED ORDER — ALPRAZOLAM 0.5 MG PO TABS: 0.5000 mg | ORAL_TABLET | Freq: Three times a day (TID) | ORAL | Status: DC | PRN

## 2016-04-17 NOTE — Telephone Encounter (Signed)
Pt is needing a refill on the following medication. Pt states she only has 2 pills to last her, [facility] [place]  Thanks CC   ALPRAZolam (XANAX) 0.5 MG tablet

## 2016-05-18 ENCOUNTER — Ambulatory Visit: Payer: BLUE CROSS/BLUE SHIELD | Admitting: Physician Assistant

## 2016-06-06 DIAGNOSIS — Z23 Encounter for immunization: Secondary | ICD-10-CM | POA: Diagnosis not present

## 2016-06-12 ENCOUNTER — Encounter: Payer: Self-pay | Admitting: Physician Assistant

## 2016-06-12 ENCOUNTER — Ambulatory Visit (INDEPENDENT_AMBULATORY_CARE_PROVIDER_SITE_OTHER): Payer: BLUE CROSS/BLUE SHIELD | Admitting: Physician Assistant

## 2016-06-12 VITALS — BP 128/72 | Temp 97.7°F | Resp 16 | Wt 225.0 lb

## 2016-06-12 DIAGNOSIS — M25561 Pain in right knee: Secondary | ICD-10-CM

## 2016-06-12 DIAGNOSIS — K219 Gastro-esophageal reflux disease without esophagitis: Secondary | ICD-10-CM | POA: Diagnosis not present

## 2016-06-12 DIAGNOSIS — M25562 Pain in left knee: Secondary | ICD-10-CM | POA: Diagnosis not present

## 2016-06-12 DIAGNOSIS — N3001 Acute cystitis with hematuria: Secondary | ICD-10-CM | POA: Diagnosis not present

## 2016-06-12 DIAGNOSIS — R300 Dysuria: Secondary | ICD-10-CM | POA: Diagnosis not present

## 2016-06-12 DIAGNOSIS — F419 Anxiety disorder, unspecified: Secondary | ICD-10-CM

## 2016-06-12 LAB — POCT URINALYSIS DIPSTICK
Bilirubin, UA: NEGATIVE
GLUCOSE UA: NEGATIVE
Ketones, UA: NEGATIVE
NITRITE UA: NEGATIVE
PROTEIN UA: NEGATIVE
SPEC GRAV UA: 1.02
UROBILINOGEN UA: 0.2
pH, UA: 6

## 2016-06-12 MED ORDER — FLUOXETINE HCL 40 MG PO CAPS: 40.0000 mg | ORAL_CAPSULE | Freq: Every day | ORAL | 3 refills | Status: DC

## 2016-06-12 MED ORDER — SULFAMETHOXAZOLE-TRIMETHOPRIM 800-160 MG PO TABS: 1.0000 | ORAL_TABLET | Freq: Two times a day (BID) | ORAL | 0 refills | Status: DC

## 2016-06-12 MED ORDER — ESOMEPRAZOLE MAGNESIUM 20 MG PO CPDR: 20.0000 mg | DELAYED_RELEASE_CAPSULE | Freq: Two times a day (BID) | ORAL | 5 refills | Status: DC | PRN

## 2016-06-12 MED ORDER — NAPROXEN 500 MG PO TABS: 500.0000 mg | ORAL_TABLET | Freq: Two times a day (BID) | ORAL | 0 refills | Status: DC

## 2016-06-12 NOTE — Progress Notes (Signed)
Patient: Marie Foley Female    DOB: 1956/09/23   60 y.o.   MRN: [id number] Visit Date: 06/12/2016  Today's Provider: Margaretann Loveless, PA-C   Chief Complaint  Patient presents with  . Follow-up    3 month follow up   . Anxiety   Subjective:    HPI Patient comes in today for a 3 month follow up. She also wants to discuss possibly starting on Naprosyn to help with her knee pain. She reports that for the past few months she feels as if she is having increasing inflammation in her knees bilaterally. She has had this happen once before and was put on naprosyn for a short while, which helped.  Anxiety Patient reports that she has been stable on her current medications. She currently takes fluoxetine 40mg  daily and alprazolam 0.5mg  as needed for anxiety.  GERD Patient reports that she has not had any breakthrough symptoms while taking Nexium 20mg .  UTI Patient also reports that she has had some bladder discomfort for "a while". Patient is requesting that her urine be checked for infection. She denies all other symptoms.     Allergies  Allergen Reactions  . Diclofenac-Misoprostol      Current Outpatient Prescriptions:  .  ALPRAZolam (XANAX) 0.5 MG tablet, Take 1 tablet (0.5 mg total) by mouth every 8 (eight) hours as needed., Disp: 90 tablet, Rfl: 3 .  esomeprazole (NEXIUM) 20 MG capsule, Take 1 capsule (20 mg total) by mouth 2 (two) times daily as needed., Disp: 60 capsule, Rfl: 5 .  FLUoxetine (PROZAC) 40 MG capsule, Take 1 capsule (40 mg total) by mouth daily., Disp: 90 capsule, Rfl: 3 .  fluticasone (FLONASE) 50 MCG/ACT nasal spray, Place 2 sprays into the nose as needed. , Disp: , Rfl:  .  Fluticasone-Salmeterol (ADVAIR DISKUS) 250-50 MCG/DOSE AEPB, Inhale 1 puff into the lungs as needed. , Disp: , Rfl:   Review of Systems  Constitutional: Positive for appetite change and fatigue. Negative for activity change, chills, diaphoresis, fever and unexpected weight  change.  Respiratory: Negative.   Cardiovascular: Negative.   Gastrointestinal: Negative.   Genitourinary: Positive for dysuria. Negative for decreased urine volume, difficulty urinating, frequency, hematuria, pelvic pain, vaginal bleeding, vaginal discharge and vaginal pain.  Musculoskeletal: Positive for arthralgias.  Skin: Negative.   Neurological: Negative.     Social History  Substance Use Topics  . Smoking status: Former Smoker    Quit date: 10/01/1998  . Smokeless tobacco: Never Used  . Alcohol use Yes     Comment: ocassional   Objective:   BP 128/72 (BP Location: Right Arm, Patient Position: Sitting, Cuff Size: Large)   Temp 97.7 F (36.5 C)   Resp 16   Wt 225 lb (102.1 kg)   BMI 35.24 kg/m   Physical Exam  Constitutional: She is oriented to person, place, and time. She appears well-developed and well-nourished. No distress.  Cardiovascular: Normal rate, regular rhythm and normal heart sounds.  Exam reveals no gallop and no friction rub.   No murmur heard. Pulmonary/Chest: Effort normal and breath sounds normal. No respiratory distress. She has no wheezes. She has no rales.  Abdominal: Soft. Normal appearance and bowel sounds are normal. She exhibits no distension and no mass. There is no hepatosplenomegaly. There is tenderness in the suprapubic area. There is no rebound, no guarding and no CVA tenderness.  Musculoskeletal:       Right knee: Normal.  Left knee: Normal.  Neurological: She is alert and oriented to person, place, and time.  Skin: Skin is warm and dry. She is not diaphoretic.  Psychiatric: She has a normal mood and affect. Her behavior is normal. Judgment and thought content normal.      Assessment & Plan:     1. Acute cystitis with hematuria UA was positive for pyuria and hematuria. Will treat empirically with bactrim as below. She needs to push fluids. Will send urine for culture and f/u pending C&S results. She is to call if no improvement. -  sulfamethoxazole-trimethoprim (BACTRIM DS,SEPTRA DS) 800-160 MG tablet; Take 1 tablet by mouth 2 (two) times daily.  Dispense: 14 tablet; Refill: 0 - Urine Culture  2. Gastroesophageal reflux disease, esophagitis presence not specified Stable. Diagnosis pulled for medication refill. Continue current medical treatment plan. I will see her back in 5 months for her CPE. - esomeprazole (NEXIUM) 20 MG capsule; Take 1 capsule (20 mg total) by mouth 2 (two) times daily as needed.  Dispense: 60 capsule; Refill: 5  3. Anxiety disorder, unspecified anxiety disorder type Stable. Diagnosis pulled for medication refill. Continue current medical treatment plan. - FLUoxetine (PROZAC) 40 MG capsule; Take 1 capsule (40 mg total) by mouth daily.  Dispense: 90 capsule; Refill: 3  4. Arthralgia of both knees New problem that has happened once before. No ROM limitations or instability. Will treat with naproxen as below. Advised of GI side effects.  - naproxen (NAPROSYN) 500 MG tablet; Take 1 tablet (500 mg total) by mouth 2 (two) times daily with a meal.  Dispense: 60 tablet; Refill: 0  5. Dysuria UA positive for hematuria and pyuria. - POCT Urinalysis Dipstick       Margaretann LovelessJennifer M Burnette, PA-C  [facility]

## 2016-06-12 NOTE — Patient Instructions (Signed)

## 2016-06-14 LAB — URINE CULTURE

## 2016-06-15 ENCOUNTER — Telehealth: Payer: Self-pay | Admitting: Physician Assistant

## 2016-06-15 DIAGNOSIS — N3000 Acute cystitis without hematuria: Secondary | ICD-10-CM

## 2016-06-15 NOTE — Telephone Encounter (Signed)
Pt was just in on Monday (06-12-16) was treated for UTI and pt states that the antibiotic she was put on is not helping, pt states she was told to call back if the medication wasn't  Helping. [facility] [place]  Thanks CC

## 2016-06-15 NOTE — Telephone Encounter (Signed)
[  please review-aa 

## 2016-06-16 NOTE — Telephone Encounter (Signed)
Have her complete therapy and call if still no improvement. Push fluids.

## 2016-06-19 MED ORDER — CIPROFLOXACIN HCL 500 MG PO TABS: 500.0000 mg | ORAL_TABLET | Freq: Two times a day (BID) | ORAL | 0 refills | Status: DC

## 2016-06-19 NOTE — Telephone Encounter (Signed)
Cipro sent in.  

## 2016-06-19 NOTE — Telephone Encounter (Signed)
Patient reports that she has almost completed her abx, and she is still having symptoms. Patient is requesting a different abx. [facility] [place] Thanks!

## 2016-06-19 NOTE — Telephone Encounter (Signed)
Patient advised.

## 2016-06-19 NOTE — Telephone Encounter (Signed)
Please advise patient. Thank you-aa

## 2016-07-08 ENCOUNTER — Other Ambulatory Visit: Payer: Self-pay | Admitting: Physician Assistant

## 2016-07-08 DIAGNOSIS — M25562 Pain in left knee: Principal | ICD-10-CM

## 2016-07-08 DIAGNOSIS — M25561 Pain in right knee: Secondary | ICD-10-CM

## 2016-07-24 ENCOUNTER — Other Ambulatory Visit: Payer: Self-pay | Admitting: Physician Assistant

## 2016-08-05 ENCOUNTER — Other Ambulatory Visit: Payer: Self-pay | Admitting: Physician Assistant

## 2016-08-05 DIAGNOSIS — M25562 Pain in left knee: Principal | ICD-10-CM

## 2016-08-05 DIAGNOSIS — M25561 Pain in right knee: Secondary | ICD-10-CM

## 2016-08-19 ENCOUNTER — Other Ambulatory Visit: Payer: Self-pay | Admitting: Family Medicine

## 2016-08-19 DIAGNOSIS — F419 Anxiety disorder, unspecified: Secondary | ICD-10-CM

## 2016-08-21 NOTE — Telephone Encounter (Signed)
Please review-aa 

## 2016-09-22 ENCOUNTER — Telehealth: Payer: Self-pay | Admitting: Physician Assistant

## 2016-09-22 DIAGNOSIS — F411 Generalized anxiety disorder: Secondary | ICD-10-CM

## 2016-09-22 MED ORDER — ALPRAZOLAM 0.5 MG PO TABS: 0.5000 mg | ORAL_TABLET | Freq: Three times a day (TID) | ORAL | 3 refills | Status: DC | PRN

## 2016-09-22 NOTE — Telephone Encounter (Signed)
Please call in alprazolam 0.5mg  Take one tab PO q 8 hrs prn anxiety #90 3 RF to [facility] [address] [phone].  Thanks.

## 2016-09-22 NOTE — Telephone Encounter (Signed)
Last refill was 04/17/16. Please advise. Thanks.

## 2016-09-22 NOTE — Telephone Encounter (Signed)
Med called in  Pt informed

## 2016-09-22 NOTE — Telephone Encounter (Signed)
Pt contacted office for refill request on the following medications:  ALPRAZolam (XANAX) 0.5 MG tablet.  [facility] [place].  CB#[phone]/MW

## 2016-10-10 ENCOUNTER — Telehealth: Payer: Self-pay | Admitting: Physician Assistant

## 2016-10-10 DIAGNOSIS — R05 Cough: Secondary | ICD-10-CM

## 2016-10-10 DIAGNOSIS — R059 Cough, unspecified: Secondary | ICD-10-CM

## 2016-10-10 NOTE — Telephone Encounter (Signed)
Pt called wanting a Rx for cough medication with codeine.  She did not want to come in.  She says she has this every year.  Cough and congestion bronchitis  She knows Boneta LucksJenny will not be in until tomorrow.  Pt's call back is [phone]  Please advise.  ThanksTeri

## 2016-10-10 NOTE — Telephone Encounter (Signed)
Please advise. Emily Drozdowski, CMA  

## 2016-10-11 MED ORDER — HYDROCOD POLST-CPM POLST ER 10-8 MG/5ML PO SUER: 5.0000 mL | Freq: Two times a day (BID) | ORAL | 0 refills | Status: DC | PRN

## 2016-10-11 NOTE — Telephone Encounter (Signed)
Patient advised. Patient states she is taking Advair inhaler.

## 2016-10-11 NOTE — Telephone Encounter (Signed)
Cough Syrup printed. Patient needs to continue advair inhaler. If symptoms worsen or if SOB develops she needs to be seen.

## 2016-11-04 ENCOUNTER — Other Ambulatory Visit: Payer: Self-pay | Admitting: Physician Assistant

## 2016-11-04 DIAGNOSIS — M25562 Pain in left knee: Principal | ICD-10-CM

## 2016-11-04 DIAGNOSIS — M25561 Pain in right knee: Secondary | ICD-10-CM

## 2016-11-22 ENCOUNTER — Encounter: Payer: Self-pay | Admitting: Physician Assistant

## 2016-11-22 ENCOUNTER — Ambulatory Visit (INDEPENDENT_AMBULATORY_CARE_PROVIDER_SITE_OTHER): Payer: BLUE CROSS/BLUE SHIELD | Admitting: Physician Assistant

## 2016-11-22 VITALS — BP 130/90 | HR 95 | Temp 97.5°F | Resp 16 | Ht 67.0 in | Wt 230.6 lb

## 2016-11-22 DIAGNOSIS — Z136 Encounter for screening for cardiovascular disorders: Secondary | ICD-10-CM | POA: Diagnosis not present

## 2016-11-22 DIAGNOSIS — E559 Vitamin D deficiency, unspecified: Secondary | ICD-10-CM | POA: Diagnosis not present

## 2016-11-22 DIAGNOSIS — Z1239 Encounter for other screening for malignant neoplasm of breast: Secondary | ICD-10-CM

## 2016-11-22 DIAGNOSIS — Z0001 Encounter for general adult medical examination with abnormal findings: Secondary | ICD-10-CM | POA: Diagnosis not present

## 2016-11-22 DIAGNOSIS — Z0000 Encounter for general adult medical examination without abnormal findings: Secondary | ICD-10-CM | POA: Diagnosis not present

## 2016-11-22 DIAGNOSIS — R921 Mammographic calcification found on diagnostic imaging of breast: Secondary | ICD-10-CM

## 2016-11-22 DIAGNOSIS — R300 Dysuria: Secondary | ICD-10-CM

## 2016-11-22 DIAGNOSIS — N3000 Acute cystitis without hematuria: Secondary | ICD-10-CM

## 2016-11-22 DIAGNOSIS — Z13220 Encounter for screening for lipoid disorders: Secondary | ICD-10-CM

## 2016-11-22 DIAGNOSIS — Z1231 Encounter for screening mammogram for malignant neoplasm of breast: Secondary | ICD-10-CM

## 2016-11-22 DIAGNOSIS — M791 Myalgia: Secondary | ICD-10-CM | POA: Diagnosis not present

## 2016-11-22 DIAGNOSIS — R1013 Epigastric pain: Secondary | ICD-10-CM

## 2016-11-22 DIAGNOSIS — F419 Anxiety disorder, unspecified: Secondary | ICD-10-CM | POA: Diagnosis not present

## 2016-11-22 DIAGNOSIS — Z833 Family history of diabetes mellitus: Secondary | ICD-10-CM | POA: Diagnosis not present

## 2016-11-22 DIAGNOSIS — R5383 Other fatigue: Secondary | ICD-10-CM

## 2016-11-22 LAB — POCT URINALYSIS DIPSTICK
Bilirubin, UA: NEGATIVE
Glucose, UA: NEGATIVE
Ketones, UA: NEGATIVE
Nitrite, UA: NEGATIVE
PH UA: 6
PROTEIN UA: NEGATIVE
SPEC GRAV UA: 1.015
Urobilinogen, UA: 0.2

## 2016-11-22 MED ORDER — CIPROFLOXACIN HCL 500 MG PO TABS: 500.0000 mg | ORAL_TABLET | Freq: Two times a day (BID) | ORAL | 0 refills | Status: DC

## 2016-11-22 NOTE — Patient Instructions (Signed)
Health Maintenance for Postmenopausal Women Introduction Menopause is a normal process in which your reproductive ability comes to an end. This process happens gradually over a span of months to years, usually between the ages of 4 and 25. Menopause is complete when you have missed 12 consecutive menstrual periods. It is important to talk with your health care provider about some of the most common conditions that affect postmenopausal women, such as heart disease, cancer, and bone loss (osteoporosis). Adopting a healthy lifestyle and getting preventive care can help to promote your health and wellness. Those actions can also lower your chances of developing some of these common conditions. What should I know about menopause? During menopause, you may experience a number of symptoms, such as:  Moderate-to-severe hot flashes.  Night sweats.  Decrease in sex drive.  Mood swings.  Headaches.  Tiredness.  Irritability.  Memory problems.  Insomnia. Choosing to treat or not to treat menopausal changes is an individual decision that you make with your health care provider. What should I know about hormone replacement therapy and supplements? Hormone therapy products are effective for treating symptoms that are associated with menopause, such as hot flashes and night sweats. Hormone replacement carries certain risks, especially as you become older. If you are thinking about using estrogen or estrogen with progestin treatments, discuss the benefits and risks with your health care provider. What should I know about heart disease and stroke? Heart disease, heart attack, and stroke become more likely as you age. This may be due, in part, to the hormonal changes that your body experiences during menopause. These can affect how your body processes dietary fats, triglycerides, and cholesterol. Heart attack and stroke are both medical emergencies. There are many things that you can do to help prevent  heart disease and stroke:  Have your blood pressure checked at least every 1-2 years. High blood pressure causes heart disease and increases the risk of stroke.  If you are 70-64 years old, ask your health care provider if you should take aspirin to prevent a heart attack or a stroke.  Do not use any tobacco products, including cigarettes, chewing tobacco, or electronic cigarettes. If you need help quitting, ask your health care provider.  It is important to eat a healthy diet and maintain a healthy weight.  Be sure to include plenty of vegetables, fruits, low-fat dairy products, and lean protein.  Avoid eating foods that are high in solid fats, added sugars, or salt (sodium).  Get regular exercise. This is one of the most important things that you can do for your health.  Try to exercise for at least 150 minutes each week. The type of exercise that you do should increase your heart rate and make you sweat. This is known as moderate-intensity exercise.  Try to do strengthening exercises at least twice each week. Do these in addition to the moderate-intensity exercise.  Know your numbers.Ask your health care provider to check your cholesterol and your blood glucose. Continue to have your blood tested as directed by your health care provider. What should I know about cancer screening? There are several types of cancer. Take the following steps to reduce your risk and to catch any cancer development as early as possible. Breast Cancer  Practice breast self-awareness.  This means understanding how your breasts normally appear and feel.  It also means doing regular breast self-exams. Let your health care provider know about any changes, no matter how small.  If you are 40 or  older, have a clinician do a breast exam (clinical breast exam or CBE) every year. Depending on your age, family history, and medical history, it may be recommended that you also have a yearly breast X-ray  (mammogram).  If you have a family history of breast cancer, talk with your health care provider about genetic screening.  If you are at high risk for breast cancer, talk with your health care provider about having an MRI and a mammogram every year.  Breast cancer (BRCA) gene test is recommended for women who have family members with BRCA-related cancers. Results of the assessment will determine the need for genetic counseling and BRCA1 and for BRCA2 testing. BRCA-related cancers include these types:  Breast. This occurs in males or females.  Ovarian.  Tubal. This may also be called fallopian tube cancer.  Cancer of the abdominal or pelvic lining (peritoneal cancer).  Prostate.  Pancreatic. Cervical, Uterine, and Ovarian Cancer  Your health care provider may recommend that you be screened regularly for cancer of the pelvic organs. These include your ovaries, uterus, and vagina. This screening involves a pelvic exam, which includes checking for microscopic changes to the surface of your cervix (Pap test).  For women ages 21-65, health care providers may recommend a pelvic exam and a Pap test every three years. For women ages 50-65, they may recommend the Pap test and pelvic exam, combined with testing for human papilloma virus (HPV), every five years. Some types of HPV increase your risk of cervical cancer. Testing for HPV may also be done on women of any age who have unclear Pap test results.  Other health care providers may not recommend any screening for nonpregnant women who are considered low risk for pelvic cancer and have no symptoms. Ask your health care provider if a screening pelvic exam is right for you.  If you have had past treatment for cervical cancer or a condition that could lead to cancer, you need Pap tests and screening for cancer for at least 20 years after your treatment. If Pap tests have been discontinued for you, your risk factors (such as having a new sexual  partner) need to be reassessed to determine if you should start having screenings again. Some women have medical problems that increase the chance of getting cervical cancer. In these cases, your health care provider may recommend that you have screening and Pap tests more often.  If you have a family history of uterine cancer or ovarian cancer, talk with your health care provider about genetic screening.  If you have vaginal bleeding after reaching menopause, tell your health care provider.  There are currently no reliable tests available to screen for ovarian cancer. Lung Cancer  Lung cancer screening is recommended for adults 59-37 years old who are at high risk for lung cancer because of a history of smoking. A yearly low-dose CT scan of the lungs is recommended if you:  Currently smoke.  Have a history of at least 30 pack-years of smoking and you currently smoke or have quit within the past 15 years. A pack-year is smoking an average of one pack of cigarettes per day for one year. Yearly screening should:  Continue until it has been 15 years since you quit.  Stop if you develop a health problem that would prevent you from having lung cancer treatment. Colorectal Cancer  This type of cancer can be detected and can often be prevented.  Routine colorectal cancer screening usually begins at age 9 and  continues through age 73.  If you have risk factors for colon cancer, your health care provider may recommend that you be screened at an earlier age.  If you have a family history of colorectal cancer, talk with your health care provider about genetic screening.  Your health care provider may also recommend using home test kits to check for hidden blood in your stool.  A small camera at the end of a tube can be used to examine your colon directly (sigmoidoscopy or colonoscopy). This is done to check for the earliest forms of colorectal cancer.  Direct examination of the colon should be  repeated every 5-10 years until age 39. However, if early forms of precancerous polyps or small growths are found or if you have a family history or genetic risk for colorectal cancer, you may need to be screened more often. Skin Cancer  Check your skin from head to toe regularly.  Monitor any moles. Be sure to tell your health care provider:  About any new moles or changes in moles, especially if there is a change in a mole's shape or color.  If you have a mole that is larger than the size of a pencil eraser.  If any of your family members has a history of skin cancer, especially at a young age, talk with your health care provider about genetic screening.  Always use sunscreen. Apply sunscreen liberally and repeatedly throughout the day.  Whenever you are outside, protect yourself by wearing long sleeves, pants, a wide-brimmed hat, and sunglasses. What should I know about osteoporosis? Osteoporosis is a condition in which bone destruction happens more quickly than new bone creation. After menopause, you may be at an increased risk for osteoporosis. To help prevent osteoporosis or the bone fractures that can happen because of osteoporosis, the following is recommended:  If you are 59-26 years old, get at least 1,000 mg of calcium and at least 600 mg of vitamin D per day.  If you are older than age 1 but younger than age 67, get at least 1,200 mg of calcium and at least 600 mg of vitamin D per day.  If you are older than age 38, get at least 1,200 mg of calcium and at least 800 mg of vitamin D per day. Smoking and excessive alcohol intake increase the risk of osteoporosis. Eat foods that are rich in calcium and vitamin D, and do weight-bearing exercises several times each week as directed by your health care provider. What should I know about how menopause affects my mental health? Depression may occur at any age, but it is more common as you become older. Common symptoms of depression  include:  Low or sad mood.  Changes in sleep patterns.  Changes in appetite or eating patterns.  Feeling an overall lack of motivation or enjoyment of activities that you previously enjoyed.  Frequent crying spells. Talk with your health care provider if you think that you are experiencing depression. What should I know about immunizations? It is important that you get and maintain your immunizations. These include:  Tetanus, diphtheria, and pertussis (Tdap) booster vaccine.  Influenza every year before the flu season begins.  Pneumonia vaccine.  Shingles vaccine. Your health care provider may also recommend other immunizations. This information is not intended to replace advice given to you by your health care provider. Make sure you discuss any questions you have with your health care provider. Document Released: 11/10/2005 Document Revised: 04/07/2016 Document Reviewed: 06/22/2015  2017  Elsevier Cholelithiasis Cholelithiasis is also called "gallstones." It is a kind of gallbladder disease. The gallbladder is an organ that stores a liquid (bile) that helps you digest fat. Gallstones may not cause symptoms (may be silent gallstones) until they cause a blockage, and then they can cause pain (gallbladder attack). Follow these instructions at home:  Take over-the-counter and prescription medicines only as told by your doctor.  Stay at a healthy weight.  Eat healthy foods. This includes:  Eating fewer fatty foods, like fried foods.  Eating fewer refined carbs (refined carbohydrates). Refined carbs are breads and grains that are highly processed, like white bread and white rice. Instead, choose whole grains like whole-wheat bread and brown rice.  Eating more fiber. Almonds, fresh fruit, and beans are healthy sources of fiber.  Keep all follow-up visits as told by your doctor. This is important. Contact a doctor if:  You have sudden pain in the upper right side of your belly  (abdomen). Pain might spread to your right shoulder or your chest. This may be a sign of a gallbladder attack.  You feel sick to your stomach (are nauseous).  You throw up (vomit).  You have been diagnosed with gallstones that have no symptoms and you get:  Belly pain.  Discomfort, burning, or fullness in the upper part of your belly (indigestion). Get help right away if:  You have sudden pain in the upper right side of your belly, and it lasts for more than 2 hours.  You have belly pain that lasts for more than 5 hours.  You have a fever or chills.  You keep feeling sick to your stomach or you keep throwing up.  Your skin or the whites of your eyes turn yellow (jaundice).  You have dark-colored pee (urine).  You have light-colored poop (stool). Summary  Cholelithiasis is also called "gallstones."  The gallbladder is an organ that stores a liquid (bile) that helps you digest fat.  Silent gallstones are gallstones that do not cause symptoms.  A gallbladder attack may cause sudden pain in the upper right side of your belly. Pain might spread to your right shoulder or your chest. If this happens, contact your doctor.  If you have sudden pain in the upper right side of your belly that lasts for more than 2 hours, get help right away. This information is not intended to replace advice given to you by your health care provider. Make sure you discuss any questions you have with your health care provider. Document Released: 03/06/2008 Document Revised: 06/04/2016 Document Reviewed: 06/04/2016 Elsevier Interactive Patient Education  2017 [employer].

## 2016-11-22 NOTE — Progress Notes (Signed)
Patient: Marie Foley, Female    DOB: 02/06/1956, 61 y.o.   MRN: [id number] Visit Date: 11/22/2016  Today's Provider: Margaretann LovelessJennifer M Burnette, PA-C   No chief complaint on file.  Subjective:    Annual physical exam Marie Latchina R Manes is a 61 y.o. female who presents today for health maintenance and complete physical. She feels fairly well. She reports exercising some. She reports she is sleeping fairly well.  Last CPE:02/18/16 Pap: Hysterectomy Mammogram:08/13/15-BI-RADS 3: Probably Benign-follow-up in 12 months Colonoscopy:11/13/14 Dr.Wohl-Tubular Adenoma. Repeat in 5 years. ----------------------------------------------------------------- She also reports that she feels like she has UTI. No painful urination just discomfort. Bladder pressure.  Review of Systems  Constitutional: Positive for fatigue.       Irritability  HENT: Negative.   Eyes: Negative.   Respiratory: Positive for shortness of breath.   Gastrointestinal: Positive for abdominal distention, abdominal pain and vomiting.  Endocrine: Negative.   Genitourinary: Positive for dysuria. Negative for enuresis, flank pain, frequency, genital sores, hematuria, menstrual problem, pelvic pain, urgency, vaginal bleeding, vaginal discharge and vaginal pain.  Musculoskeletal: Positive for myalgias.  Skin: Negative.   Allergic/Immunologic: Negative.   Neurological: Negative.   Hematological: Negative.   Psychiatric/Behavioral: Positive for sleep disturbance. The patient is nervous/anxious.     Social History      She  reports that she quit smoking about 18 years ago. She has never used smokeless tobacco. She reports that she drinks alcohol. She reports that she does not use drugs.       Social History   Social History  . Marital status: Widowed    Spouse name: N/A  . Number of children: N/A  . Years of education: N/A   Social History Main Topics  . Smoking status: Former Smoker    Quit date: 10/01/1998  .  Smokeless tobacco: Never Used  . Alcohol use Yes     Comment: ocassional  . Drug use: No  . Sexual activity: Not Asked   Other Topics Concern  . None   Social History Narrative  . None    History reviewed. No pertinent past medical history.   Patient Active Problem List   Diagnosis Date Noted  . Allergic rhinitis 10/25/2015  . Clinical depression 10/25/2015  . Blood in the urine 10/25/2015  . RAD (reactive airway disease) 10/25/2015  . Avitaminosis D 10/25/2015  . Calcification of left breast 07/27/2015  . Anxiety disorder 04/26/2015  . Urinary calculus 07/19/2007    Past Surgical History:  Procedure Laterality Date  . ABDOMINAL HYSTERECTOMY  10/07/2002   Dr. Luella Cookosenow; due to hemorrhaging  . BREAST BIOPSY Left 1980's   EXCISIONAL BX - NEG    Family History        Family Status  Relation Status  . Mother Deceased at age 61   ectopic pregnancy  . Father Deceased at age 61  . Sister Alive  . Brother Alive  . Sister Alive  . Sister Alive  . Brother Alive        Her family history includes Alzheimer's disease in her father; Breast cancer in her paternal grandmother; Depression in her father, sister, sister, and sister; Early death in her mother.     Allergies  Allergen Reactions  . Diclofenac-Misoprostol      Current Outpatient Prescriptions:  .  ALPRAZolam (XANAX) 0.5 MG tablet, Take 1 tablet (0.5 mg total) by mouth every 8 (eight) hours as needed., Disp: 90 tablet, Rfl: 3 .  esomeprazole (NEXIUM)  20 MG capsule, Take 1 capsule (20 mg total) by mouth 2 (two) times daily as needed., Disp: 60 capsule, Rfl: 5 .  FLUoxetine (PROZAC) 40 MG capsule, TAKE ONE CAPSULE BY MOUTH ONCE A DAY, Disp: 90 capsule, Rfl: 2 .  fluticasone (FLONASE) 50 MCG/ACT nasal spray, PLACE 1 SPRAY INTO BOTH NOSTRILS 2 (TWO) TIMES DAILY., Disp: 48 g, Rfl: 1 .  Fluticasone-Salmeterol (ADVAIR DISKUS) 250-50 MCG/DOSE AEPB, Inhale 1 puff into the lungs as needed. , Disp: , Rfl:  .  naproxen  (NAPROSYN) 500 MG tablet, TAKE 1 TABLET (500 MG TOTAL) BY MOUTH 2 (TWO) TIMES DAILY WITH A MEAL., Disp: 60 tablet, Rfl: 1 .  chlorpheniramine-HYDROcodone (TUSSIONEX PENNKINETIC ER) 10-8 MG/5ML SUER, Take 5 mLs by mouth every 12 (twelve) hours as needed for cough. (Patient not taking: Reported on 11/22/2016), Disp: 140 mL, Rfl: 0 .  ciprofloxacin (CIPRO) 500 MG tablet, Take 1 tablet (500 mg total) by mouth 2 (two) times daily. (Patient not taking: Reported on 11/22/2016), Disp: 14 tablet, Rfl: 0   Patient Care Team: Margaretann Loveless, PA-C as PCP - General (Family Medicine)      Objective:   Vitals: BP 130/90 (BP Location: Right Arm, Patient Position: Sitting, Cuff Size: Normal)   Pulse 95   Temp 97.5 F (36.4 C) (Oral)   Resp 16   Ht 5\' 7"  (1.702 m)   Wt 230 lb 9.6 oz (104.6 kg)   SpO2 95%   BMI 36.12 kg/m    Physical Exam  Constitutional: She is oriented to person, place, and time. She appears well-developed and well-nourished. No distress.  HENT:  Head: Normocephalic and atraumatic.  Right Ear: Tympanic membrane, external ear and ear canal normal.  Left Ear: Tympanic membrane, external ear and ear canal normal.  Nose: Nose normal.  Mouth/Throat: Uvula is midline, oropharynx is clear and moist and mucous membranes are normal. No oropharyngeal exudate.  Eyes: Conjunctivae and EOM are normal. Pupils are equal, round, and reactive to light. Right eye exhibits no discharge. Left eye exhibits no discharge. No scleral icterus.  Neck: Normal range of motion. Neck supple. No JVD present. Carotid bruit is not present. No tracheal deviation present. No thyromegaly present.  Cardiovascular: Normal rate, regular rhythm, normal heart sounds and intact distal pulses.  Exam reveals no gallop and no friction rub.   No murmur heard. Pulmonary/Chest: Effort normal and breath sounds normal. No respiratory distress. She has no wheezes. She has no rales. She exhibits no tenderness. Right breast exhibits  no inverted nipple, no mass, no nipple discharge, no skin change and no tenderness. Left breast exhibits no inverted nipple, no mass, no nipple discharge, no skin change and no tenderness. Breasts are symmetrical.  Abdominal: Soft. Bowel sounds are normal. She exhibits no distension and no mass. There is tenderness in the suprapubic area. There is no rebound, no guarding and no CVA tenderness.  Musculoskeletal: Normal range of motion. She exhibits no edema or tenderness.  Lymphadenopathy:    She has no cervical adenopathy.  Neurological: She is alert and oriented to person, place, and time.  Skin: Skin is warm and dry. No Foley noted. She is not diaphoretic.  Psychiatric: She has a normal mood and affect. Her behavior is normal. Judgment and thought content normal.  Vitals reviewed.    Depression Screen PHQ 2/9 Scores 02/18/2016  PHQ - 2 Score 2  PHQ- 9 Score 6     Assessment & Plan:     Routine Health Maintenance and  Physical Exam  Exercise Activities and Dietary recommendations Goals    . Exercise 150 minutes per week (moderate activity)       Immunization History  Administered Date(s) Administered  . Tdap 10/15/2013    Health Maintenance  Topic Date Due  . PAP SMEAR  06/28/2013  . MAMMOGRAM  08/12/2017  . TETANUS/TDAP  10/16/2023  . COLONOSCOPY  11/13/2024  . INFLUENZA VACCINE  Completed  . Hepatitis C Screening  Completed  . HIV Screening  Completed     Discussed health benefits of physical activity, and encouraged her to engage in regular exercise appropriate for her age and condition.    1. Annual physical exam Normal physical exam today. Will check labs as below and f/u pending lab results. If labs are stable and WNL she will not need to have these rechecked for one year at her next annual physical exam. She is to call the office in the meantime if she has any acute issue, questions or concerns. - CBC w/Diff/Platelet - Comprehensive Metabolic Panel  (CMET)  2. Dysuria Ua had small amount of pyuria. Will treat empirically with cipro and send urine for C&S. I will f/u pending C&S results.  - POCT urinalysis dipstick  3. Breast cancer screening Breast exam today was normal. There is no family history of breast cancer. She does perform regular self breast exams. Mammogram was ordered as below. Information for [facility] clinic was given to patient so she may schedule her mammogram at her convenience. - MM Digital Diagnostic Bilat; Future  4. Calcification of left breast - MM Digital Diagnostic Bilat; Future  5. Avitaminosis D Will check labs as below and f/u pending results. - Vitamin D (25 hydroxy)  6. Anxiety disorder, unspecified type Continue fluoxetine 40mg  and alprazolam 0.5mg  BID.  7. Fatigue, unspecified type Will check labs as below and f/u pending results. - CBC w/Diff/Platelet - TSH - B12  8. Encounter for lipid screening for cardiovascular disease Will check labs as below and f/u pending results. - Lipid Profile  9. Family history of diabetes mellitus (DM) Will check labs as below and f/u pending results. - HgB A1c  10. Acute cystitis without hematuria Ua had small amount of pyuria. Will treat empirically with cipro and send urine for C&S. I will f/u pending C&S results.  - ciprofloxacin (CIPRO) 500 MG tablet; Take 1 tablet (500 mg total) by mouth 2 (two) times daily.  Dispense: 14 tablet; Refill: 0 - Urine Culture  11. Epigastric pain Suspect possible gallbladder since the pain occurs after eating. Patient states she will eat a small amount then have severe bloating and cramping in the epigastric region and to the RUQ. Discussed RUQ [place] but patient refused at this time. She is on nexium daily for GERD.  --------------------------------------------------------------------    Margaretann Loveless, PA-C  [facility]

## 2016-11-23 ENCOUNTER — Telehealth: Payer: Self-pay

## 2016-11-23 LAB — TSH: TSH: 1.69 u[IU]/mL (ref 0.450–4.500)

## 2016-11-23 LAB — COMPREHENSIVE METABOLIC PANEL
A/G RATIO: 1.5 (ref 1.2–2.2)
ALBUMIN: 4.4 g/dL (ref 3.6–4.8)
ALT: 20 IU/L (ref 0–32)
AST: 21 IU/L (ref 0–40)
Alkaline Phosphatase: 118 IU/L — ABNORMAL HIGH (ref 39–117)
BUN / CREAT RATIO: 15 (ref 12–28)
BUN: 11 mg/dL (ref 8–27)
Bilirubin Total: 0.5 mg/dL (ref 0.0–1.2)
CALCIUM: 9.9 mg/dL (ref 8.7–10.3)
CO2: 26 mmol/L (ref 18–29)
Chloride: 96 mmol/L (ref 96–106)
Creatinine, Ser: 0.71 mg/dL (ref 0.57–1.00)
GFR calc non Af Amer: 93 (ref >59)
GFR, EST AFRICAN AMERICAN: 107 (ref >59)
GLOBULIN, TOTAL: 3 (ref 1.5–4.5)
Glucose: 91 mg/dL (ref 65–99)
Potassium: 4.5 mmol/L (ref 3.5–5.2)
SODIUM: 139 mmol/L (ref 134–144)
TOTAL PROTEIN: 7.4 g/dL (ref 6.0–8.5)

## 2016-11-23 LAB — CBC WITH DIFFERENTIAL/PLATELET
BASOS: 1 %
Basophils Absolute: 0.1 10*3/uL (ref 0.0–0.2)
EOS (ABSOLUTE): 0.9 10*3/uL — ABNORMAL HIGH (ref 0.0–0.4)
EOS: 8 %
HEMATOCRIT: 40.5 % (ref 34.0–46.6)
Hemoglobin: 13.3 g/dL (ref 11.1–15.9)
IMMATURE GRANULOCYTES: 0 %
Immature Grans (Abs): 0 10*3/uL (ref 0.0–0.1)
Lymphocytes Absolute: 3.2 10*3/uL — ABNORMAL HIGH (ref 0.7–3.1)
Lymphs: 28 %
MCH: 30.2 pg (ref 26.6–33.0)
MCHC: 32.8 g/dL (ref 31.5–35.7)
MCV: 92 fL (ref 79–97)
MONOS ABS: 0.7 10*3/uL (ref 0.1–0.9)
Monocytes: 6 %
NEUTROS PCT: 57 %
Neutrophils Absolute: 6.4 10*3/uL (ref 1.4–7.0)
Platelets: 471 10*3/uL — ABNORMAL HIGH (ref 150–379)
RBC: 4.4 x10E6/uL (ref 3.77–5.28)
RDW: 13.7 % (ref 12.3–15.4)
WBC: 11.3 10*3/uL — ABNORMAL HIGH (ref 3.4–10.8)

## 2016-11-23 LAB — HEMOGLOBIN A1C
ESTIMATED AVERAGE GLUCOSE: 120
Hgb A1c MFr Bld: 5.8 % — ABNORMAL HIGH (ref 4.8–5.6)

## 2016-11-23 LAB — LIPID PANEL
CHOL/HDL RATIO: 3.6 (ref 0.0–4.4)
Cholesterol, Total: 244 mg/dL — ABNORMAL HIGH (ref 100–199)
HDL: 67 mg/dL (ref >39)
LDL CALC: 162 — AB (ref 0–99)
Triglycerides: 73 mg/dL (ref 0–149)
VLDL Cholesterol Cal: 15 (ref 5–40)

## 2016-11-23 LAB — VITAMIN D 25 HYDROXY (VIT D DEFICIENCY, FRACTURES): Vit D, 25-Hydroxy: 41.1 ng/mL (ref 30.0–100.0)

## 2016-11-23 LAB — VITAMIN B12: VITAMIN B 12: 337 pg/mL (ref 232–1245)

## 2016-11-23 NOTE — Telephone Encounter (Signed)
-----   Message from Margaretann LovelessJennifer M Burnette, PA-C sent at 11/23/2016 10:13 AM EST ----- Cholesterol is up from last year. A1c stable at 5.8 which is still in prediabetic range. Continue healthy lifestyle modifications including healthy diet habits limiting fats and carbs and increasing physical activity to try to get in 150 min moderate activity. Vit D and B12 are normal. TSH normal. All other labs are WNL and/or stable.

## 2016-11-23 NOTE — Telephone Encounter (Signed)
Patient advised as directed below.  Thanks,  -Babbie Dondlinger 

## 2016-11-24 LAB — URINE CULTURE

## 2016-12-04 ENCOUNTER — Other Ambulatory Visit: Payer: Self-pay

## 2016-12-04 DIAGNOSIS — M25562 Pain in left knee: Principal | ICD-10-CM

## 2016-12-04 DIAGNOSIS — M25561 Pain in right knee: Secondary | ICD-10-CM

## 2016-12-04 MED ORDER — NAPROXEN 500 MG PO TABS: 500.0000 mg | ORAL_TABLET | Freq: Two times a day (BID) | ORAL | 1 refills | Status: DC

## 2016-12-04 NOTE — Telephone Encounter (Signed)
Refill request for a 90 day supply.  Thanks,  -Joseline

## 2016-12-29 ENCOUNTER — Ambulatory Visit
Admission: RE | Admit: 2016-12-29 | Discharge: 2016-12-29 | Disposition: A | Discharge status: Home / Self Care | Payer: BLUE CROSS/BLUE SHIELD | Source: Ambulatory Visit | Attending: Physician Assistant | Admitting: Physician Assistant

## 2016-12-29 ENCOUNTER — Telehealth: Payer: Self-pay

## 2016-12-29 DIAGNOSIS — Z1239 Encounter for other screening for malignant neoplasm of breast: Secondary | ICD-10-CM

## 2016-12-29 DIAGNOSIS — R928 Other abnormal and inconclusive findings on diagnostic imaging of breast: Secondary | ICD-10-CM | POA: Diagnosis not present

## 2016-12-29 DIAGNOSIS — R921 Mammographic calcification found on diagnostic imaging of breast: Secondary | ICD-10-CM | POA: Insufficient documentation

## 2016-12-29 DIAGNOSIS — N6489 Other specified disorders of breast: Secondary | ICD-10-CM | POA: Diagnosis not present

## 2016-12-29 NOTE — Telephone Encounter (Signed)
No answer/mailbox is full.  Thanks,  -Thurman Sarver 

## 2016-12-29 NOTE — Telephone Encounter (Signed)
-----   Message from Margaretann Loveless, [place] sent at 12/29/2016  4:11 PM EDT ----- Mammogram with benign changes in the R breast that are stable from 2015. Continue routine screenings yearly.

## 2017-01-01 NOTE — Telephone Encounter (Signed)
No answer, box still full ED

## 2017-01-01 NOTE — Telephone Encounter (Signed)
Patient was notified with results. Expressed understanding.

## 2017-01-01 NOTE — Telephone Encounter (Signed)
na

## 2017-02-15 ENCOUNTER — Other Ambulatory Visit: Payer: Self-pay | Admitting: Physician Assistant

## 2017-02-15 ENCOUNTER — Other Ambulatory Visit: Payer: Self-pay

## 2017-02-15 DIAGNOSIS — M25561 Pain in right knee: Secondary | ICD-10-CM

## 2017-02-15 DIAGNOSIS — M25562 Pain in left knee: Principal | ICD-10-CM

## 2017-02-15 MED ORDER — NAPROXEN 500 MG PO TABS: 500.0000 mg | ORAL_TABLET | Freq: Two times a day (BID) | ORAL | 1 refills | Status: DC

## 2017-03-01 ENCOUNTER — Other Ambulatory Visit: Payer: Self-pay | Admitting: Physician Assistant

## 2017-03-01 DIAGNOSIS — F411 Generalized anxiety disorder: Secondary | ICD-10-CM

## 2017-03-01 NOTE — Telephone Encounter (Signed)
Pt contacted office for refill request on the following medications:  ALPRAZolam (XANAX) 0.5 MG tablet.  [facility].  CB#[phone]

## 2017-05-15 ENCOUNTER — Other Ambulatory Visit: Payer: Self-pay | Admitting: Physician Assistant

## 2017-05-15 DIAGNOSIS — F411 Generalized anxiety disorder: Secondary | ICD-10-CM

## 2017-05-15 MED ORDER — ALPRAZOLAM 0.5 MG PO TABS: 0.5000 mg | ORAL_TABLET | Freq: Three times a day (TID) | ORAL | 1 refills | Status: DC | PRN

## 2017-05-15 NOTE — Telephone Encounter (Signed)
Called in Rx as below.  

## 2017-05-15 NOTE — Telephone Encounter (Signed)
Pt needs refill on her xanax  She uses [facility]  Her call back is [phone]  Thanks, Fortune Brandsteri

## 2017-05-15 NOTE — Telephone Encounter (Signed)
NCCSR reviewed.  Rx refilled 

## 2017-07-02 ENCOUNTER — Ambulatory Visit (INDEPENDENT_AMBULATORY_CARE_PROVIDER_SITE_OTHER): Payer: BLUE CROSS/BLUE SHIELD | Admitting: Physician Assistant

## 2017-07-02 ENCOUNTER — Encounter: Payer: Self-pay | Admitting: Physician Assistant

## 2017-07-02 VITALS — BP 120/84 | HR 94 | Temp 97.6°F | Resp 16 | Ht 67.0 in | Wt 231.0 lb

## 2017-07-02 DIAGNOSIS — N3001 Acute cystitis with hematuria: Secondary | ICD-10-CM | POA: Diagnosis not present

## 2017-07-02 DIAGNOSIS — R300 Dysuria: Secondary | ICD-10-CM

## 2017-07-02 LAB — POCT URINALYSIS DIPSTICK
Bilirubin, UA: NEGATIVE
Glucose, UA: NEGATIVE
Ketones, UA: NEGATIVE
Nitrite, UA: NEGATIVE
PH UA: 7.5 (ref 5.0–8.0)
PROTEIN UA: NEGATIVE
Spec Grav, UA: 1.01 (ref 1.010–1.025)
UROBILINOGEN UA: 0.2 U/dL

## 2017-07-02 LAB — URINALYSIS, MICROSCOPIC ONLY
Bacteria, UA: NONE SEEN /HPF
Hyaline Cast: NONE SEEN /LPF

## 2017-07-02 MED ORDER — SULFAMETHOXAZOLE-TRIMETHOPRIM 800-160 MG PO TABS: 1.0000 | ORAL_TABLET | Freq: Two times a day (BID) | ORAL | 0 refills | Status: DC

## 2017-07-02 NOTE — Patient Instructions (Signed)

## 2017-07-02 NOTE — Progress Notes (Signed)
Patient: Marie Foley Female    DOB: 1956-01-22   61 y.o.   MRN: [id number] Visit Date: 07/02/2017  Today's Provider: Margaretann Loveless, PA-C   Chief Complaint  Patient presents with  . Urinary Tract Infection   Subjective:    HPI Urinary Tract Infection: Patient complains of burning with urination, dysuria, hesitancy and chills She has had symptoms for 4 days. Patient also complains of chills. Patient denies back pain, stomach ache and vaginal discharge. Patient does not have a history of recurrent UTI.  Patient does not have a history of pyelonephritis.      Allergies  Allergen Reactions  . Diclofenac-Misoprostol      Current Outpatient Prescriptions:  .  ALPRAZolam (XANAX) 0.5 MG tablet, Take 1 tablet (0.5 mg total) by mouth every 8 (eight) hours as needed. for anxiety, Disp: 90 tablet, Rfl: 1 .  esomeprazole (NEXIUM) 20 MG capsule, Take 1 capsule (20 mg total) by mouth 2 (two) times daily as needed., Disp: 60 capsule, Rfl: 5 .  FLUoxetine (PROZAC) 40 MG capsule, TAKE ONE CAPSULE BY MOUTH ONCE A DAY, Disp: 90 capsule, Rfl: 2 .  fluticasone (FLONASE) 50 MCG/ACT nasal spray, PLACE 1 SPRAY INTO BOTH NOSTRILS 2 (TWO) TIMES DAILY., Disp: 48 g, Rfl: 1 .  Fluticasone-Salmeterol (ADVAIR DISKUS) 250-50 MCG/DOSE AEPB, Inhale 1 puff into the lungs as needed. , Disp: , Rfl:  .  naproxen (NAPROSYN) 500 MG tablet, Take 1 tablet (500 mg total) by mouth 2 (two) times daily with a meal., Disp: 90 tablet, Rfl: 1  Review of Systems  Constitutional: Positive for chills. Negative for fever.  Respiratory: Negative.   Cardiovascular: Negative.   Gastrointestinal: Positive for abdominal pain and nausea. Negative for blood in stool, constipation, diarrhea, rectal pain and vomiting.  Genitourinary: Positive for difficulty urinating, dysuria and frequency.  Musculoskeletal: Negative for back pain.    Social History  Substance Use Topics  . Smoking status: Former Smoker    Quit date:  10/01/1998  . Smokeless tobacco: Never Used  . Alcohol use Yes     Comment: ocassional   Objective:   BP 120/84 (BP Location: Left Arm, Patient Position: Sitting, Cuff Size: Large)   Pulse 94   Temp 97.6 F (36.4 C) (Oral)   Resp 16   Ht  (1.702 m)   Wt 231 lb (104.8 kg)   SpO2 98%   BMI 36.18 kg/m  Vitals:   07/02/17 1057  BP: 120/84  Pulse: 94  Resp: 16  Temp: 97.6 F (36.4 C)  TempSrc: Oral  SpO2: 98%  Weight: 231 lb (104.8 kg)  Height:  (1.702 m)     Physical Exam  Constitutional: She is oriented to person, place, and time. She appears well-developed and well-nourished. No distress.  Cardiovascular: Normal rate, regular rhythm and normal heart sounds.  Exam reveals no gallop and no friction rub.   No murmur heard. Pulmonary/Chest: Effort normal and breath sounds normal. No respiratory distress. She has no wheezes. She has no rales.  Abdominal: Soft. Normal appearance and bowel sounds are normal. She exhibits no distension and no mass. There is no hepatosplenomegaly. There is tenderness in the suprapubic area. There is no rebound, no guarding and no CVA tenderness.  Neurological: She is alert and oriented to person, place, and time.  Skin: Skin is warm and dry. She is not diaphoretic.  Vitals reviewed.     Assessment & Plan:     1.  Acute cystitis with hematuria Worsening symptoms. UA positive. Will treat empirically with Bactrim as below. Advised to use AZO tabs OTC.  Continue to push fluids. Urine sent for culture. Will follow up pending C&S results. Will also send for microscopy for hematuria. She is to call if symptoms do not improve or if they worsen.  - sulfamethoxazole-trimethoprim (BACTRIM DS,SEPTRA DS) 800-160 MG tablet; Take 1 tablet by mouth 2 (two) times daily.  Dispense: 20 tablet; Refill: 0 - Urine Culture - Urinalysis, microscopic only  2. Dysuria See above medical treatment plan. - POCT urinalysis dipstick       Margaretann Loveless,  PA-C  [facility]

## 2017-07-03 LAB — URINE CULTURE
MICRO NUMBER:: 81085377
SPECIMEN QUALITY: ADEQUATE

## 2017-07-09 ENCOUNTER — Telehealth: Payer: Self-pay

## 2017-07-09 NOTE — Telephone Encounter (Signed)
Urine culture was negative so just stop antibiotic. Will need appt for vaginal exam as she is probably having postmenopausal vaginal atrophy that is causing symptoms.

## 2017-07-09 NOTE — Telephone Encounter (Signed)
Pt called saying she was in the office on 07/02/2017 for a UTI.  She was prescribed Bactrim she reports her urinary symptoms are not better.  (Burning, frequency)  Now the antibiotic is making her sick.  She would like something else sent to [facility].    Contact number:  [phone].  Thanks,   -Vernona Rieger

## 2017-07-09 NOTE — Telephone Encounter (Signed)
Patient advised as directed below. Per patient confused because she does have a UTI? Re explained that urine culture came back Negative-no bacteria, phone keep cutting off and think phone was having some signal problems or patient hung up. Jenni aware since she was sitting next to me.  Thanks,  -Piccola Arico

## 2017-07-10 NOTE — Telephone Encounter (Signed)
The UA had some leuks but urine culture was negative meaning no bacterial growth, meaning no UTI. No need for any further antibiotic use. If she is still having symptoms she needs to be seen.

## 2017-07-10 NOTE — Telephone Encounter (Signed)
Patient advised as below. Patient reports that she is urinating well. Patient reports she will call back to schedule appt if needed. sd

## 2017-07-10 NOTE — Telephone Encounter (Signed)
Patient requesting to have Bactrim added to allergy list due to vomiting. sd

## 2017-07-10 NOTE — Telephone Encounter (Signed)
Pt stated that she is confused and she doesn't understand why she needs to come in for vaginal exam. Pt stated that at her OV she was told she had a UTI and was put on Bactrim. Pt stated that she is allergic to Bactrim because it is a sulfa drug. Pt stated it caused vomiting and she took it for 4 days. Pt stated that urine frequency has gone back to normal but she still has a little burning when urinating. Pt is requesting another antibiotic sent to [facility]. Pt stated that she thinks the bactrim helped and she thinks another antibiotic will take care if the burning. Please advise. Thanks TNP

## 2017-07-10 NOTE — Telephone Encounter (Signed)
lmtcb

## 2017-08-06 ENCOUNTER — Telehealth: Payer: Self-pay | Admitting: Physician Assistant

## 2017-08-06 DIAGNOSIS — F411 Generalized anxiety disorder: Secondary | ICD-10-CM

## 2017-08-06 MED ORDER — ALPRAZOLAM 0.5 MG PO TABS: 0.5000 mg | ORAL_TABLET | Freq: Three times a day (TID) | ORAL | 5 refills | Status: DC | PRN

## 2017-08-06 NOTE — Telephone Encounter (Signed)
Ok to phone in refill: Alprazolam 0.5mg  Take 1 tab PO q 8 hrs prn anxiety #90 5 refills.

## 2017-08-06 NOTE — Telephone Encounter (Signed)
Pt contacted office for refill request on the following medications:  ALPRAZolam (XANAX) 0.5 MG tablet.  [facility] [place]  CB#[phone]/MW °

## 2017-08-25 ENCOUNTER — Other Ambulatory Visit: Payer: Self-pay | Admitting: Physician Assistant

## 2017-08-25 DIAGNOSIS — J301 Allergic rhinitis due to pollen: Secondary | ICD-10-CM

## 2017-08-27 ENCOUNTER — Other Ambulatory Visit: Payer: Self-pay | Admitting: Physician Assistant

## 2017-08-27 DIAGNOSIS — F419 Anxiety disorder, unspecified: Secondary | ICD-10-CM

## 2018-03-07 ENCOUNTER — Other Ambulatory Visit: Payer: Self-pay | Admitting: Physician Assistant

## 2018-03-07 DIAGNOSIS — F411 Generalized anxiety disorder: Secondary | ICD-10-CM

## 2018-05-01 ENCOUNTER — Telehealth: Payer: Self-pay | Admitting: Physician Assistant

## 2018-05-01 DIAGNOSIS — N3000 Acute cystitis without hematuria: Secondary | ICD-10-CM

## 2018-05-01 MED ORDER — NITROFURANTOIN MONOHYD MACRO 100 MG PO CAPS: 100.0000 mg | ORAL_CAPSULE | Freq: Two times a day (BID) | ORAL | 0 refills | Status: DC

## 2018-05-01 NOTE — Telephone Encounter (Signed)
Please Review

## 2018-05-01 NOTE — Telephone Encounter (Signed)
Sent in KirklinMacrobid. If no improvements she will need to be seen

## 2018-05-01 NOTE — Telephone Encounter (Signed)
Pt called saying she thinks she has a UTI and cannot get off work.  She wants to know if you can send something to the pharmacy. She said she has been treated before for a UTI   [facility]  Pt's CB# [phone]  Thanks Barth Kirksteri

## 2018-05-02 NOTE — Telephone Encounter (Signed)
Mailbox full

## 2018-05-12 DIAGNOSIS — L500 Allergic urticaria: Secondary | ICD-10-CM | POA: Diagnosis not present

## 2018-05-19 DIAGNOSIS — N390 Urinary tract infection, site not specified: Secondary | ICD-10-CM | POA: Diagnosis not present

## 2018-06-18 ENCOUNTER — Other Ambulatory Visit: Payer: Self-pay | Admitting: Physician Assistant

## 2018-06-18 DIAGNOSIS — F419 Anxiety disorder, unspecified: Secondary | ICD-10-CM

## 2018-07-03 ENCOUNTER — Other Ambulatory Visit: Payer: Self-pay | Admitting: Physician Assistant

## 2018-07-03 DIAGNOSIS — J301 Allergic rhinitis due to pollen: Secondary | ICD-10-CM

## 2018-07-10 ENCOUNTER — Encounter: Payer: Self-pay | Admitting: Physician Assistant

## 2018-07-10 ENCOUNTER — Ambulatory Visit (INDEPENDENT_AMBULATORY_CARE_PROVIDER_SITE_OTHER): Payer: BLUE CROSS/BLUE SHIELD | Admitting: Physician Assistant

## 2018-07-10 VITALS — BP 128/88 | HR 97 | Temp 97.5°F | Wt 230.0 lb

## 2018-07-10 DIAGNOSIS — N952 Postmenopausal atrophic vaginitis: Secondary | ICD-10-CM

## 2018-07-10 DIAGNOSIS — R311 Benign essential microscopic hematuria: Secondary | ICD-10-CM

## 2018-07-10 DIAGNOSIS — Z1239 Encounter for other screening for malignant neoplasm of breast: Secondary | ICD-10-CM | POA: Diagnosis not present

## 2018-07-10 DIAGNOSIS — Z0000 Encounter for general adult medical examination without abnormal findings: Secondary | ICD-10-CM | POA: Diagnosis not present

## 2018-07-10 DIAGNOSIS — F3342 Major depressive disorder, recurrent, in full remission: Secondary | ICD-10-CM

## 2018-07-10 DIAGNOSIS — E559 Vitamin D deficiency, unspecified: Secondary | ICD-10-CM | POA: Diagnosis not present

## 2018-07-10 DIAGNOSIS — R7309 Other abnormal glucose: Secondary | ICD-10-CM | POA: Insufficient documentation

## 2018-07-10 DIAGNOSIS — Z2821 Immunization not carried out because of patient refusal: Secondary | ICD-10-CM

## 2018-07-10 DIAGNOSIS — Z6836 Body mass index (BMI) 36.0-36.9, adult: Secondary | ICD-10-CM | POA: Diagnosis not present

## 2018-07-10 DIAGNOSIS — F411 Generalized anxiety disorder: Secondary | ICD-10-CM

## 2018-07-10 DIAGNOSIS — E7800 Pure hypercholesterolemia, unspecified: Secondary | ICD-10-CM | POA: Insufficient documentation

## 2018-07-10 LAB — POCT URINALYSIS DIPSTICK
Bilirubin, UA: NEGATIVE
Glucose, UA: NEGATIVE
KETONES UA: NEGATIVE
LEUKOCYTES UA: NEGATIVE
Nitrite, UA: NEGATIVE
Odor: NEGATIVE
Protein, UA: POSITIVE — AB
SPEC GRAV UA: 1.025 (ref 1.010–1.025)
Urobilinogen, UA: 0.2 E.U./dL
pH, UA: 6 (ref 5.0–8.0)

## 2018-07-10 MED ORDER — ESTROGENS, CONJUGATED 0.625 MG/GM VA CREA: 1.0000 | TOPICAL_CREAM | Freq: Every day | VAGINAL | 12 refills | Status: DC

## 2018-07-10 MED ORDER — ALPRAZOLAM 0.5 MG PO TABS: 0.5000 mg | ORAL_TABLET | Freq: Three times a day (TID) | ORAL | 5 refills | Status: DC | PRN

## 2018-07-10 NOTE — Patient Instructions (Addendum)
Tumeric 500-1000mg  daily for joint pain/inflammation   Atrophic Vaginitis Atrophic vaginitis is when the tissues that line the vagina become dry and thin. This is caused by a drop in estrogen. Estrogen helps:  To keep the vagina moist.  To make a clear fluid that helps: ? To lubricate the vagina for sex. ? To protect the vagina from infection.  If the lining of the vagina is dry and thin, it may:  Make sex painful. It may also cause bleeding.  Cause a feeling of: ? Burning. ? Irritation. ? Itchiness.  Make an exam of your vagina painful. It may also cause bleeding.  Make you lose interest in sex.  Cause a burning feeling when you pee.  Make your vaginal fluid (discharge) brown or yellow.  For some women, there are no symptoms. This condition is most common in women who do not get their regular menstrual periods anymore (menopause). This often starts when a woman is 75-17 years old. Follow these instructions at home:  Take medicines only as told by your doctor. Do not use any herbal or alternative medicines unless your doctor says it is okay.  Use over-the-counter products for dryness only as told by your doctor. These include: ? Creams. ? Lubricants. ? Moisturizers.  Do not douche.  Do not use products that can make your vagina dry. These include: ? Scented feminine sprays. ? Scented tampons. ? Scented soaps.  If it hurts to have sex, tell your sexual partner. Contact a doctor if:  Your discharge looks different than normal.  Your vagina has an unusual smell.  You have new symptoms.  Your symptoms do not get better with treatment.  Your symptoms get worse. This information is not intended to replace advice given to you by your health care provider. Make sure you discuss any questions you have with your health care provider. Document Released: 03/06/2008 Document Revised: 02/24/2016 Document Reviewed: 09/09/2014 Elsevier Interactive Patient Education  [employer].

## 2018-07-10 NOTE — Progress Notes (Signed)
Patient: Marie Foley, Female    DOB: Jul 15, 1956, 63 y.o.   MRN: [id number] Visit Date: 07/10/2018  Today's Provider: Margaretann Loveless, PA-C   No chief complaint on file.  Subjective:  HPI   Annual physical exam Marie Foley is a 62 y.o. female who presents today for health maintenance and complete physical. She feels fairly well. She reports exercising none. She reports she is sleeping well.  She does report in August she had went to the beach and had a crab cake (unsure if this is source) and developed severe itching and hives. Was seen at [facility] and given a steroid shot, benadryl orally. Started on zyrtec BID, benadryl at bedtime and famotidine BID. Still reports some itching remains.   Also having some burning in the vaginal area. Not with urination, but wants to have urine checked as she does have h/o recurrent urinary infections.   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 Has been evaluated by Urology in the past without any source. Hematuria has never been gross.     Review of Systems  Constitutional: Positive for activity change, chills and fatigue.  HENT: Negative.   Eyes: Negative.   Respiratory: Negative.   Cardiovascular: Negative.   Gastrointestinal: Positive for abdominal pain and vomiting.  Endocrine: Negative.   Genitourinary: Positive for vaginal pain.  Musculoskeletal: Positive for arthralgias, back pain and myalgias.  Skin: Positive for rash.  Allergic/Immunologic: Negative.   Neurological: Negative.   Hematological: Negative.   Psychiatric/Behavioral: Negative.     Social History She  reports that she quit smoking about 19 years ago. She has never used smokeless tobacco. She reports that she drinks alcohol. She reports that she does not use drugs. Social History   Socioeconomic History  . Marital status: Widowed    Spouse name: Not on file  . Number of children: Not on file  . Years of education: Not on file  . Highest  education level: Not on file  Occupational History  . Not on file  Social Needs  . Financial resource strain: Not on file  . Food insecurity:    Worry: Not on file    Inability: Not on file  . Transportation needs:    Medical: Not on file    Non-medical: Not on file  Tobacco Use  . Smoking status: Former Smoker    Last attempt to quit: 10/01/1998    Years since quitting: 19.7  . Smokeless tobacco: Never Used  Substance and Sexual Activity  . Alcohol use: Yes    Comment: ocassional  . Drug use: No  . Sexual activity: Not on file  Lifestyle  . Physical activity:    Days per week: Not on file    Minutes per session: Not on file  . Stress: Not on file  Relationships  . Social connections:    Talks on phone: Not on file    Gets together: Not on file    Attends religious service: Not on file    Active member of club or organization: Not on file    Attends meetings of clubs or organizations: Not on file    Relationship status: Not on file  Other Topics Concern  . Not on file  Social History Narrative  . Not on file    Patient Active Problem List   Diagnosis Date Noted  . Allergic rhinitis 10/25/2015  . Clinical depression 10/25/2015  . Blood in the urine 10/25/2015  . RAD (reactive airway  disease) 10/25/2015  . Avitaminosis D 10/25/2015  . Calcification of left breast 07/27/2015  . Anxiety disorder 04/26/2015  . Urinary calculus 07/19/2007    Past Surgical History:  Procedure Laterality Date  . ABDOMINAL HYSTERECTOMY  10/07/2002   Dr. Luella Cook; due to hemorrhaging  . BREAST BIOPSY Left 1980's   EXCISIONAL BX - NEG    Family History  Family Status  Relation Name Status  . Mother  Deceased at age 91       ectopic pregnancy  . Father  Deceased at age 10  . Sister  Alive  . Brother  Alive  . Sister  Alive  . Sister  Alive  . Brother  Alive  . PGM  (Not Specified)   Her family history includes Alzheimer's disease in her father; Breast cancer in her paternal  grandmother; Depression in her father, sister, sister, and sister; Early death in her mother.     Allergies  Allergen Reactions  . Bactrim [Sulfamethoxazole-Trimethoprim] Nausea And Vomiting  . Diclofenac-Misoprostol     Previous Medications   ALPRAZOLAM (XANAX) 0.5 MG TABLET    TAKE 1 TABLET BY MOUTH EVERY 8 HOURS AS NEEDED FOR ANXIETY   ESOMEPRAZOLE (NEXIUM) 20 MG CAPSULE    Take 1 capsule (20 mg total) by mouth 2 (two) times daily as needed.   FLUOXETINE (PROZAC) 40 MG CAPSULE    TAKE 1 CAPSULE (40 MG TOTAL) BY MOUTH DAILY.   FLUTICASONE (FLONASE) 50 MCG/ACT NASAL SPRAY    PLACE 1 SPRAY INTO BOTH NOSTRILS 2 (TWO) TIMES DAILY.   FLUTICASONE-SALMETEROL (ADVAIR DISKUS) 250-50 MCG/DOSE AEPB    Inhale 1 puff into the lungs as needed.    NAPROXEN (NAPROSYN) 500 MG TABLET    Take 1 tablet (500 mg total) by mouth 2 (two) times daily with a meal.   NITROFURANTOIN, MACROCRYSTAL-MONOHYDRATE, (MACROBID) 100 MG CAPSULE    Take 1 capsule (100 mg total) by mouth 2 (two) times daily.    Patient Care Team: Margaretann Loveless, PA-C as PCP - General (Family Medicine)      Objective:   Vitals: BP 128/88 (BP Location: Left Arm, Patient Position: Sitting, Cuff Size: Normal)   Pulse 97   Temp (!) 97.5 F (36.4 C) (Oral)   Wt 230 lb (104.3 kg)   SpO2 96%   BMI 36.02 kg/m    Physical Exam  Constitutional: She is oriented to person, place, and time. She appears well-developed and well-nourished. No distress.  HENT:  Head: Normocephalic and atraumatic.  Right Ear: Hearing, tympanic membrane, external ear and ear canal normal.  Left Ear: Hearing, tympanic membrane, external ear and ear canal normal.  Nose: Nose normal.  Mouth/Throat: Uvula is midline, oropharynx is clear and moist and mucous membranes are normal. No oropharyngeal exudate.  Eyes: Pupils are equal, round, and reactive to light. Conjunctivae and EOM are normal. Right eye exhibits no discharge. Left eye exhibits no discharge. No  scleral icterus.  Neck: Normal range of motion. Neck supple. No JVD present. Carotid bruit is not present. No tracheal deviation present. No thyromegaly present.  Cardiovascular: Normal rate, regular rhythm, normal heart sounds and intact distal pulses. Exam reveals no gallop and no friction rub.  No murmur heard. Pulmonary/Chest: Effort normal and breath sounds normal. No respiratory distress. She has no wheezes. She has no rales. She exhibits no tenderness.  Abdominal: Soft. Bowel sounds are normal. She exhibits no distension and no mass. There is no tenderness. There is no rebound and no guarding.  Musculoskeletal: Normal range of motion. She exhibits no edema or tenderness.  Lymphadenopathy:    She has no cervical adenopathy.  Neurological: She is alert and oriented to person, place, and time.  Skin: Skin is warm and dry. No rash noted. She is not diaphoretic.  Psychiatric: She has a normal mood and affect. Her behavior is normal. Judgment and thought content normal.  Vitals reviewed.    Depression Screen PHQ 2/9 Scores 07/10/2018 07/10/2018 07/02/2017 02/18/2016  PHQ - 2 Score 3 3 1 2   PHQ- 9 Score 7 7 2 6       Assessment & Plan:     Routine Health Maintenance and Physical Exam  Exercise Activities and Dietary recommendations Goals    . Exercise 150 minutes per week (moderate activity)       Immunization History  Administered Date(s) Administered  . Tdap 10/15/2013    Health Maintenance  Topic Date Due  . PAP SMEAR  06/28/2013  . INFLUENZA VACCINE  05/02/2018  . MAMMOGRAM  12/30/2018  . TETANUS/TDAP  10/16/2023  . COLONOSCOPY  11/13/2024  . Hepatitis C Screening  Completed  . HIV Screening  Completed     Discussed health benefits of physical activity, and encouraged her to engage in regular exercise appropriate for her age and condition.    1. Annual physical exam Normal physical exam today. Will check labs as below and f/u pending lab results. If labs are  stable and WNL she will not need to have these rechecked for one year at her next annual physical exam. She is to call the office in the meantime if she has any acute issue, questions or concerns. - POCT urinalysis dipstick  2. Breast cancer screening Breast exam today was normal. There is no family history of breast cancer. She does perform regular self breast exams. Mammogram was ordered as below. Information for [facility] was given to patient so she may schedule her mammogram at her convenience.  3. Avitaminosis D H/O this. Will check labs as below and f/u pending results. - CBC with Differential/Platelet - Comprehensive metabolic panel - Vitamin D (25 hydroxy)  4. Benign essential microscopic hematuria Normal UA today.  - POCT urinalysis dipstick  5. Recurrent major depressive disorder, in full remission (HCC) Stable. Continue fluoxetine 40mg  and xanax 0.5mg  prn. Will check labs as below and f/u pending results. - TSH  6. Elevated hemoglobin A1c H/O this. Will check labs as below and f/u pending results. - CBC with Differential/Platelet - Comprehensive metabolic panel - Hemoglobin A1c - Lipid panel  7. Hypercholesterolemia Declines statins. Will check labs as below and f/u pending results. - CBC with Differential/Platelet - Comprehensive metabolic panel - Lipid panel  8. BMI 36.0-36.9,adult Counseled patient on healthy lifestyle modifications including dieting and exercise. Will check labs as below and f/u pending results. - CBC with Differential/Platelet - Comprehensive metabolic panel - Hemoglobin A1c - Lipid panel - TSH  9. Vaginal atrophy Worsening. Will use premarin as below topically. Call if no improvements.  - conjugated estrogens (PREMARIN) vaginal cream; Place 1 Applicatorful vaginally daily. May decrease to three times per week, then once weekly as symptoms improve  Dispense: 42.5 g; Refill: 12  10. GAD (generalized anxiety disorder) Stable.  Diagnosis pulled for medication refill. Continue current medical treatment plan. - ALPRAZolam (XANAX) 0.5 MG tablet; Take 1 tablet (0.5 mg total) by mouth every 8 (eight) hours as needed. for anxiety  Dispense: 90 tablet; Refill: 5  11. Influenza vaccination declined  --------------------------------------------------------------------

## 2018-07-11 LAB — COMPREHENSIVE METABOLIC PANEL
ALBUMIN: 4.6 g/dL (ref 3.6–4.8)
ALT: 17 IU/L (ref 0–32)
AST: 17 IU/L (ref 0–40)
Albumin/Globulin Ratio: 2 (ref 1.2–2.2)
Alkaline Phosphatase: 105 IU/L (ref 39–117)
BUN / CREAT RATIO: 17 (ref 12–28)
BUN: 12 mg/dL (ref 8–27)
Bilirubin Total: 0.5 mg/dL (ref 0.0–1.2)
CO2: 24 mmol/L (ref 20–29)
CREATININE: 0.72 mg/dL (ref 0.57–1.00)
Calcium: 10.1 mg/dL (ref 8.7–10.3)
Chloride: 99 mmol/L (ref 96–106)
GFR, EST AFRICAN AMERICAN: 104 mL/min/{1.73_m2} (ref >59)
GFR, EST NON AFRICAN AMERICAN: 90 mL/min/{1.73_m2} (ref >59)
GLOBULIN, TOTAL: 2.3 g/dL (ref 1.5–4.5)
Glucose: 88 mg/dL (ref 65–99)
Potassium: 4.5 mmol/L (ref 3.5–5.2)
Sodium: 139 mmol/L (ref 134–144)
Total Protein: 6.9 g/dL (ref 6.0–8.5)

## 2018-07-11 LAB — LIPID PANEL
CHOL/HDL RATIO: 3.5 ratio (ref 0.0–4.4)
Cholesterol, Total: 216 mg/dL — ABNORMAL HIGH (ref 100–199)
HDL: 61 mg/dL (ref >39)
LDL CALC: 138 mg/dL — AB (ref 0–99)
TRIGLYCERIDES: 85 mg/dL (ref 0–149)
VLDL CHOLESTEROL CAL: 17 mg/dL (ref 5–40)

## 2018-07-11 LAB — CBC WITH DIFFERENTIAL/PLATELET
BASOS ABS: 0.1 10*3/uL (ref 0.0–0.2)
Basos: 1 %
EOS (ABSOLUTE): 0.6 10*3/uL — AB (ref 0.0–0.4)
Eos: 6 %
HEMOGLOBIN: 12.9 g/dL (ref 11.1–15.9)
Hematocrit: 39.1 % (ref 34.0–46.6)
Immature Grans (Abs): 0 10*3/uL (ref 0.0–0.1)
Immature Granulocytes: 0 %
LYMPHS ABS: 3 10*3/uL (ref 0.7–3.1)
Lymphs: 29 %
MCH: 30.1 pg (ref 26.6–33.0)
MCHC: 33 g/dL (ref 31.5–35.7)
MCV: 91 fL (ref 79–97)
MONOS ABS: 0.7 10*3/uL (ref 0.1–0.9)
Monocytes: 7 %
NEUTROS ABS: 5.9 10*3/uL (ref 1.4–7.0)
Neutrophils: 57 %
PLATELETS: 448 10*3/uL (ref 150–450)
RBC: 4.28 x10E6/uL (ref 3.77–5.28)
RDW: 13 % (ref 12.3–15.4)
WBC: 10.3 10*3/uL (ref 3.4–10.8)

## 2018-07-11 LAB — HEMOGLOBIN A1C
Est. average glucose Bld gHb Est-mCnc: 128 mg/dL
Hgb A1c MFr Bld: 6.1 % — ABNORMAL HIGH (ref 4.8–5.6)

## 2018-07-11 LAB — VITAMIN D 25 HYDROXY (VIT D DEFICIENCY, FRACTURES): Vit D, 25-Hydroxy: 36.4 ng/mL (ref 30.0–100.0)

## 2018-07-11 LAB — TSH: TSH: 1.84 u[IU]/mL (ref 0.450–4.500)

## 2018-07-12 ENCOUNTER — Telehealth: Payer: Self-pay

## 2018-07-12 NOTE — Telephone Encounter (Signed)
Patient advised and verbally voiced understanding.  

## 2018-07-12 NOTE — Telephone Encounter (Signed)
Pt returned call for results. Pt request call back. Please advise. Thanks TNP

## 2018-07-12 NOTE — Telephone Encounter (Signed)
-----   Message from Margaretann Loveless, [place] sent at 07/12/2018 10:53 AM EDT ----- Blood count is normal. Kidney and liver function is normal. A1c up slightly from 5.8 to 6.1 now. Make sure to work on lifestyle modifications with diet and exercise. Limit carbs and sugars in diet. Cholesterol improved from last year. Thyroid is normal. Vit D is normal.

## 2018-07-12 NOTE — Telephone Encounter (Signed)
No answer/mailbox is full not accepting messages at this time.

## 2019-01-21 ENCOUNTER — Telehealth: Payer: Self-pay

## 2019-01-21 NOTE — Telephone Encounter (Signed)
[facility]. She is experiencing dysuria and low back pain the past 3-4 days. She states Antony Contras normally sends a Advertising account executive to the pharmacy. Offered patient a E-visit. She requested a message be sent to [place]. Patient is aware Antony Contras is out of the office today. CB# [phone]

## 2019-01-22 NOTE — Telephone Encounter (Signed)
No she normally has to be seen to give [place] a urine specimen.

## 2019-01-22 NOTE — Telephone Encounter (Signed)
Patient was advised and scheduled appt.  

## 2019-01-23 ENCOUNTER — Ambulatory Visit (INDEPENDENT_AMBULATORY_CARE_PROVIDER_SITE_OTHER): Payer: BLUE CROSS/BLUE SHIELD | Admitting: Physician Assistant

## 2019-01-23 ENCOUNTER — Encounter: Payer: Self-pay | Admitting: Physician Assistant

## 2019-01-23 ENCOUNTER — Other Ambulatory Visit: Payer: Self-pay

## 2019-01-23 VITALS — BP 123/85 | HR 98 | Temp 97.5°F | Resp 16

## 2019-01-23 DIAGNOSIS — R300 Dysuria: Secondary | ICD-10-CM | POA: Diagnosis not present

## 2019-01-23 DIAGNOSIS — N3000 Acute cystitis without hematuria: Secondary | ICD-10-CM

## 2019-01-23 LAB — POCT URINALYSIS DIPSTICK
Appearance: ABNORMAL
Bilirubin, UA: NEGATIVE
Glucose, UA: NEGATIVE
Nitrite, UA: NEGATIVE
Odor: NORMAL
Protein, UA: NEGATIVE
Spec Grav, UA: 1.02 (ref 1.010–1.025)
Urobilinogen, UA: 0.2 E.U./dL
pH, UA: 6 (ref 5.0–8.0)

## 2019-01-23 MED ORDER — CIPROFLOXACIN HCL 500 MG PO TABS: 500.0000 mg | ORAL_TABLET | Freq: Two times a day (BID) | ORAL | 0 refills | Status: DC

## 2019-01-23 MED ORDER — PHENAZOPYRIDINE HCL 200 MG PO TABS: 200.0000 mg | ORAL_TABLET | Freq: Three times a day (TID) | ORAL | 0 refills | Status: DC | PRN

## 2019-01-23 NOTE — Progress Notes (Signed)
Patient: Marie Foley Female    DOB: 07/25/1956   63 y.o.   MRN: [id number] Visit Date: 01/23/2019  Today's Provider: Margaretann LovelessJennifer M Murlin Schrieber, PA-C   Chief Complaint  Patient presents with  . Dysuria   Subjective:     HPI   She is experiencing dysuria and low back pain the past 3-4 days. Patient states she feels a lot of discomfort in vaginal area. Patient has not taken any medications for her symptoms. She does have h/o UTIs.     Allergies  Allergen Reactions  . Bactrim [Sulfamethoxazole-Trimethoprim] Nausea And Vomiting  . Diclofenac-Misoprostol   . Macrobid [Nitrofurantoin Macrocrystal] Nausea And Vomiting     Current Outpatient Medications:  .  ALPRAZolam (XANAX) 0.5 MG tablet, Take 1 tablet (0.5 mg total) by mouth every 8 (eight) hours as needed. for anxiety, Disp: 90 tablet, Rfl: 5 .  conjugated estrogens (PREMARIN) vaginal cream, Place 1 Applicatorful vaginally daily. May decrease to three times per week, then once weekly as symptoms improve, Disp: 42.5 g, Rfl: 12 .  FLUoxetine (PROZAC) 40 MG capsule, TAKE 1 CAPSULE (40 MG TOTAL) BY MOUTH DAILY., Disp: 90 capsule, Rfl: 1 .  fluticasone (FLONASE) 50 MCG/ACT nasal spray, PLACE 1 SPRAY INTO BOTH NOSTRILS 2 (TWO) TIMES DAILY., Disp: 48 g, Rfl: 1 .  Fluticasone-Salmeterol (ADVAIR DISKUS) 250-50 MCG/DOSE AEPB, Inhale 1 puff into the lungs as needed. , Disp: , Rfl:   Review of Systems  Constitutional: Negative for appetite change, chills, fatigue and fever.  Respiratory: Negative for chest tightness and shortness of breath.   Cardiovascular: Negative for chest pain and palpitations.  Gastrointestinal: Negative for abdominal pain, nausea and vomiting.  Genitourinary: Positive for dysuria, frequency, pelvic pain and urgency. Negative for flank pain.  Musculoskeletal: Positive for back pain.  Neurological: Negative for dizziness and weakness.    Social History   Tobacco Use  . Smoking status: Former Smoker    Last  attempt to quit: 10/01/1998    Years since quitting: 20.3  . Smokeless tobacco: Never Used  Substance Use Topics  . Alcohol use: Yes    Comment: ocassional      Objective:   BP 123/85 (BP Location: Left Arm, Patient Position: Sitting, Cuff Size: Large)   Pulse 98   Temp (!) 97.5 F (36.4 C) (Oral)   Resp 16   SpO2 96%  Vitals:   01/23/19 1502  BP: 123/85  Pulse: 98  Resp: 16  Temp: (!) 97.5 F (36.4 C)  TempSrc: Oral  SpO2: 96%     Physical Exam Vitals signs reviewed.  Constitutional:      General: She is not in acute distress.    Appearance: Normal appearance. She is well-developed. She is obese. She is not ill-appearing or diaphoretic.  Cardiovascular:     Rate and Rhythm: Normal rate and regular rhythm.     Heart sounds: Normal heart sounds. No murmur. No friction rub. No gallop.   Pulmonary:     Effort: Pulmonary effort is normal. No respiratory distress.     Breath sounds: Normal breath sounds. No wheezing or rales.  Abdominal:     General: Bowel sounds are normal. There is no distension.     Palpations: Abdomen is soft. There is no mass.     Tenderness: There is abdominal tenderness in the suprapubic area. There is no guarding or rebound.  Skin:    General: Skin is warm and dry.  Neurological:  Mental Status: She is alert and oriented to person, place, and time.         Assessment & Plan    1. Acute cystitis without hematuria Worsening symptoms. UA positive. Will treat empirically with Ciprofloxacin as below. Pyridium given for spasm. Continue to push fluids. Urine sent for culture. Will follow up pending C&S results. She is to call if symptoms do not improve or if they worsen.  - Urine Culture - ciprofloxacin (CIPRO) 500 MG tablet; Take 1 tablet (500 mg total) by mouth 2 (two) times daily.  Dispense: 20 tablet; Refill: 0 - phenazopyridine (PYRIDIUM) 200 MG tablet; Take 1 tablet (200 mg total) by mouth 3 (three) times daily as needed for pain.   Dispense: 15 tablet; Refill: 0  2. Dysuria See above medical treatment plan. - POCT urinalysis dipstick     Margaretann Loveless, PA-C  [facility]

## 2019-01-26 LAB — URINE CULTURE

## 2019-01-27 ENCOUNTER — Telehealth: Payer: Self-pay

## 2019-01-27 NOTE — Telephone Encounter (Signed)
Noted  

## 2019-01-27 NOTE — Telephone Encounter (Signed)
Patient was advised and states she is still having back pain but symptoms has improved a little. Patient states she is going to wait until she finish up her antibiotics to see how the symptoms are before calling the office back.

## 2019-01-27 NOTE — Telephone Encounter (Signed)
-----   Message from Margaretann Loveless, [place] sent at 01/27/2019  9:15 AM EDT ----- Urine culture had mixed bacterial flora. Have symptoms improved with antibiotic? If so, continue until completed.

## 2019-02-05 ENCOUNTER — Telehealth: Payer: Self-pay

## 2019-02-05 DIAGNOSIS — N3000 Acute cystitis without hematuria: Secondary | ICD-10-CM

## 2019-02-05 MED ORDER — AMOXICILLIN-POT CLAVULANATE 875-125 MG PO TABS: 1.0000 | ORAL_TABLET | Freq: Two times a day (BID) | ORAL | 0 refills | Status: DC

## 2019-02-05 NOTE — Addendum Note (Signed)
Addended by: Margaretann Loveless on: 02/05/2019 11:34 AM   Modules accepted: Orders

## 2019-02-05 NOTE — Telephone Encounter (Signed)
Patient called and stated she has finished her antibiotics but is still having the back pain and discomfort in the vaginal area. Patient wants to know if any other around of antibiotics could be send in for her. Please advice.

## 2019-02-05 NOTE — Telephone Encounter (Signed)
Augmentin sent in [facility]

## 2019-02-05 NOTE — Telephone Encounter (Signed)
Her urine culture was inconclusive, but did not have much bacteria. I would recommend for [place] to get a CT scan and make sure it is not a kidney stone. If agreeable I will order.

## 2019-02-05 NOTE — Telephone Encounter (Signed)
Patient reports that she prefers another round of antibiotic feels like she doesn't have a kidney stone to get a CT scan.

## 2019-02-12 ENCOUNTER — Other Ambulatory Visit: Payer: Self-pay | Admitting: Physician Assistant

## 2019-02-12 DIAGNOSIS — F411 Generalized anxiety disorder: Secondary | ICD-10-CM

## 2019-03-26 ENCOUNTER — Other Ambulatory Visit: Payer: Self-pay | Admitting: Physician Assistant

## 2019-03-26 DIAGNOSIS — F419 Anxiety disorder, unspecified: Secondary | ICD-10-CM

## 2019-04-19 ENCOUNTER — Other Ambulatory Visit: Payer: Self-pay | Admitting: Physician Assistant

## 2019-04-19 DIAGNOSIS — J301 Allergic rhinitis due to pollen: Secondary | ICD-10-CM

## 2019-04-30 ENCOUNTER — Telehealth: Payer: Self-pay | Admitting: Physician Assistant

## 2019-04-30 DIAGNOSIS — N3000 Acute cystitis without hematuria: Secondary | ICD-10-CM

## 2019-04-30 MED ORDER — AMOXICILLIN-POT CLAVULANATE 875-125 MG PO TABS: 1.0000 | ORAL_TABLET | Freq: Two times a day (BID) | ORAL | 0 refills | Status: DC

## 2019-04-30 NOTE — Telephone Encounter (Signed)
Pt advised.   Thanks,   -Ardenia Stiner  

## 2019-04-30 NOTE — Telephone Encounter (Signed)
Sent in Augmentin.

## 2019-04-30 NOTE — Telephone Encounter (Signed)
pt thinks she has another UTI.  She said you have sent something to the pharmacy before for her when she has had one.  She said she cannot get off work right now to come in.  [facility]  CB#  [phone]  [place]

## 2019-07-09 ENCOUNTER — Telehealth: Payer: Self-pay | Admitting: Physician Assistant

## 2019-07-09 NOTE — Telephone Encounter (Signed)
Pt called wanting to know what her herpes blood test showed back in 2015 when she was seeing Benetta Spar  CB# [phone]  [place]

## 2019-07-10 NOTE — Telephone Encounter (Signed)
Spoke with patient and she asked if she has Herpes read to her what Marie Foley. NP wrote in 2012. Patient reports that she has never had a fever blister type. Reports that she think she may or may not have it feels a burning on her skin in the genital area. She feels this is not from UTI or coming from taking the antibiotic and would like to get tested. Offered patient an office visit per patient she is going to call back to schedule. She wants the provider to know what is going on with her and that she was not aware that her partner who she is no longer with had herpes until he admitted to her that he did. She said "I may or may not" have it.

## 2019-07-10 NOTE — Telephone Encounter (Signed)
We have printed labs and patient can come pick up.

## 2019-07-10 NOTE — Telephone Encounter (Signed)
Pt calling back for 2nd time needing to know on her herpes blood test done in 2015.  Thanks, [employer]

## 2019-07-10 NOTE — Telephone Encounter (Signed)
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 She does not have the genital type". Can you double check for me before I call her back please.

## 2019-07-15 ENCOUNTER — Ambulatory Visit: Payer: BC Managed Care – PPO | Admitting: Family Medicine

## 2019-07-15 ENCOUNTER — Other Ambulatory Visit: Payer: Self-pay

## 2019-07-15 ENCOUNTER — Encounter: Payer: Self-pay | Admitting: Family Medicine

## 2019-07-15 VITALS — BP 133/79 | HR 89 | Temp 97.8°F

## 2019-07-15 DIAGNOSIS — N3091 Cystitis, unspecified with hematuria: Secondary | ICD-10-CM

## 2019-07-15 LAB — POCT URINALYSIS DIPSTICK
Bilirubin, UA: NEGATIVE
Glucose, UA: NEGATIVE
Ketones, UA: NEGATIVE
Nitrite, UA: NEGATIVE
Protein, UA: NEGATIVE
Spec Grav, UA: 1.02 (ref 1.010–1.025)
Urobilinogen, UA: 0.2 E.U./dL
pH, UA: 6 (ref 5.0–8.0)

## 2019-07-15 MED ORDER — CEPHALEXIN 500 MG PO CAPS: 500.0000 mg | ORAL_CAPSULE | Freq: Two times a day (BID) | ORAL | 0 refills | Status: AC

## 2019-07-15 NOTE — Progress Notes (Signed)
Patient: Marie Foley Female    DOB: 14-Jul-1956   63 y.o.   MRN: [id number] Visit Date: 07/16/2019  Today's Provider: Lavon Paganini, MD   Chief Complaint  Patient presents with  . Back Pain   Subjective:    I, Tiburcio Pea, CMA, am acting as a scribe for Lavon Paganini, MD.    Female GU Problem Primary symptoms comment: urine odor. This is a new problem. Episode onset: 1 week ago. The problem occurs constantly. The problem has been unchanged. Associated symptoms include abdominal pain. Associated symptoms comments: Urine odor . She has tried nothing for the symptoms.   Feels similar to previous UTIs. Also experiencing chills and suprapubic pain Burning in genital region. Unsure if there are any vaginal lesions  She remains nervous about her possibility of having herpes, even though she does not have any genital lesions.  She states that in the past, she had a sexual partner who told her after they had been sexually active that he had a history of herpes.  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  She has never had any outbreaks of oral or genital herpes.  Allergies  Allergen Reactions  . Bactrim [Sulfamethoxazole-Trimethoprim] Nausea And Vomiting  . Diclofenac-Misoprostol   . Macrobid [Nitrofurantoin Macrocrystal] Nausea And Vomiting     Current Outpatient Medications:  .  ALPRAZolam (XANAX) 0.5 MG tablet, TAKE 1 TABLET (0.5 MG TOTAL) BY MOUTH EVERY 8 (EIGHT) HOURS AS NEEDED. FOR ANXIETY, Disp: 90 tablet, Rfl: 5 .  conjugated estrogens (PREMARIN) vaginal cream, Place 1 Applicatorful vaginally daily. May decrease to three times per week, then once weekly as symptoms improve, Disp: 42.5 g, Rfl: 12 .  FLUoxetine (PROZAC) 40 MG capsule, TAKE 1 CAPSULE (40 MG TOTAL) BY MOUTH DAILY., Disp: 90 capsule, Rfl: 1 .  fluticasone (FLONASE) 50 MCG/ACT nasal spray, PLACE 1 SPRAY INTO BOTH NOSTRILS 2 (TWO) TIMES DAILY., Disp:  48 mL, Rfl: 1 .  Fluticasone-Salmeterol (ADVAIR DISKUS) 250-50 MCG/DOSE AEPB, Inhale 1 puff into the lungs as needed. , Disp: , Rfl:  .  phenazopyridine (PYRIDIUM) 200 MG tablet, Take 1 tablet (200 mg total) by mouth 3 (three) times daily as needed for pain., Disp: 15 tablet, Rfl: 0 .  cephALEXin (KEFLEX) 500 MG capsule, Take 1 capsule (500 mg total) by mouth 2 (two) times daily for 5 days., Disp: 10 capsule, Rfl: 0  Review of Systems  Constitutional: Negative.   Respiratory: Negative.   Cardiovascular: Negative.   Gastrointestinal: Positive for abdominal pain.  Genitourinary: Negative.     Social History   Tobacco Use  . Smoking status: Former Smoker    Quit date: 10/01/1998    Years since quitting: 20.8  . Smokeless tobacco: Never Used  Substance Use Topics  . Alcohol use: Yes    Comment: ocassional      Objective:   BP 133/79 (BP Location: Right Arm, Patient Position: Sitting, Cuff Size: Normal)   Pulse 89   Temp 97.8 F (36.6 C) (Oral)   SpO2 98%  Vitals:   07/15/19 1128  BP: 133/79  Pulse: 89  Temp: 97.8 F (36.6 C)  TempSrc: Oral  SpO2: 98%  There is no height or weight on file to calculate BMI.   Physical Exam Vitals signs reviewed.  Constitutional:      General: She is not in acute distress.    Appearance: Normal appearance. She is not diaphoretic.  HENT:     Head: Normocephalic and atraumatic.  Eyes:     General: No scleral icterus.    Conjunctiva/sclera: Conjunctivae normal.  Neck:     Musculoskeletal: Neck supple.  Cardiovascular:     Rate and Rhythm: Normal rate and regular rhythm.     Pulses: Normal pulses.     Heart sounds: Normal heart sounds. No murmur.  Pulmonary:     Effort: Pulmonary effort is normal. No respiratory distress.     Breath sounds: Normal breath sounds. No wheezing.  Abdominal:     General: There is no distension.     Palpations: Abdomen is soft.     Tenderness: There is no abdominal tenderness. There is no right CVA  tenderness, left CVA tenderness or guarding.  Genitourinary:    General: Normal vulva.     Comments: No genital lesions Musculoskeletal:     Right lower leg: No edema.     Left lower leg: No edema.  Lymphadenopathy:     Cervical: No cervical adenopathy.  Skin:    General: Skin is warm and dry.     Capillary Refill: Capillary refill takes less than 2 seconds.     Findings: No rash.  Neurological:     Mental Status: She is alert and oriented to person, place, and time. Mental status is at baseline.  Psychiatric:     Comments: Anxious      Results for orders placed or performed in visit on 07/15/19  Urinalysis, microscopic only  Result Value Ref Range   WBC, UA 6-10 (A) 0 - 5 /hpf   RBC 0-2 0 - 2 /hpf   Epithelial Cells (non renal) 0-10 0 - 10 /hpf   Casts None seen None seen /lpf   Mucus, UA Present Not Estab.   Bacteria, UA Few None seen/Few  POCT Urinalysis Dipstick  Result Value Ref Range   Color, UA yellow    Clarity, UA hazy    Glucose, UA Negative Negative   Bilirubin, UA negative    Ketones, UA negative    Spec Grav, UA 1.020 1.010 - 1.025   Blood, UA hemolyzed small    pH, UA 6.0 5.0 - 8.0   Protein, UA Negative Negative   Urobilinogen, UA 0.2 0.2 or 1.0 E.U./dL   Nitrite, UA negative    Leukocytes, UA Moderate (2+) (A) Negative   Appearance     Odor         Assessment & Plan   1. Cystitis with hematuria - Symptoms and UA consistent with UTI -No systemic symptoms or signs of pyelonephritis - Given hematuria, will send urine micro to confirm and will plan to recheck urine in about 6 weeks after completion of antibiotics to ensure hematuria has cleared -Will start treatment with 5day course of Keflex  -We will send urine culture to confirm sensitivities -Discussed return precautions  - Urine Culture - Urinalysis, microscopic only  Meds ordered this encounter  Medications  . cephALEXin (KEFLEX) 500 MG capsule    Sig: Take 1 capsule (500 mg total) by  mouth 2 (two) times daily for 5 days.    Dispense:  10 capsule    Refill:  0     Return if symptoms worsen or fail to improve.   The entirety of the information documented in the History of Present Illness, Review of Systems and Physical Exam were personally obtained by me. Portions of this information were initially documented by Cheron Every , CMA and reviewed by  me for thoroughness and accuracy.    Johnathin Vanderschaaf, Marzella SchleinAngela M, MD MPH [facility]

## 2019-07-15 NOTE — Patient Instructions (Signed)
Urinary Tract Infection, Adult A urinary tract infection (UTI) is an infection of any part of the urinary tract. The urinary tract includes:  The kidneys.  The ureters.  The bladder.  The urethra. These organs make, store, and get rid of pee (urine) in the body. What are the causes? This is caused by germs (bacteria) in your genital area. These germs grow and cause swelling (inflammation) of your urinary tract. What increases the risk? You are more likely to develop this condition if:  You have a small, thin tube (catheter) to drain pee.  You cannot control when you pee or poop (incontinence).  You are female, and: ? You use these methods to prevent pregnancy: ? A medicine that kills sperm (spermicide). ? A device that blocks sperm (diaphragm). ? You have low levels of a female hormone (estrogen). ? You are pregnant.  You have genes that add to your risk.  You are sexually active.  You take antibiotic medicines.  You have trouble peeing because of: ? A prostate that is bigger than normal, if you are female. ? A blockage in the part of your body that drains pee from the bladder (urethra). ? A kidney stone. ? A nerve condition that affects your bladder (neurogenic bladder). ? Not getting enough to drink. ? Not peeing often enough.  You have other conditions, such as: ? Diabetes. ? A weak disease-fighting system (immune system). ? Sickle cell disease. ? Gout. ? Injury of the spine. What are the signs or symptoms? Symptoms of this condition include:  Needing to pee right away (urgently).  Peeing often.  Peeing small amounts often.  Pain or burning when peeing.  Blood in the pee.  Pee that smells bad or not like normal.  Trouble peeing.  Pee that is cloudy.  Fluid coming from the vagina, if you are female.  Pain in the belly or lower back. Other symptoms include:  Throwing up (vomiting).  No urge to eat.  Feeling mixed up (confused).  Being tired  and grouchy (irritable).  A fever.  Watery poop (diarrhea). How is this treated? This condition may be treated with:  Antibiotic medicine.  Other medicines.  Drinking enough water. Follow these instructions at home:  Medicines  Take over-the-counter and prescription medicines only as told by your doctor.  If you were prescribed an antibiotic medicine, take it as told by your doctor. Do not stop taking it even if you start to feel better. General instructions  Make sure you: ? Pee until your bladder is empty. ? Do not hold pee for a long time. ? Empty your bladder after sex. ? Wipe from front to back after pooping if you are a female. Use each tissue one time when you wipe.  Drink enough fluid to keep your pee pale yellow.  Keep all follow-up visits as told by your doctor. This is important. Contact a doctor if:  You do not get better after 1-2 days.  Your symptoms go away and then come back. Get help right away if:  You have very bad back pain.  You have very bad pain in your lower belly.  You have a fever.  You are sick to your stomach (nauseous).  You are throwing up. Summary  A urinary tract infection (UTI) is an infection of any part of the urinary tract.  This condition is caused by germs in your genital area.  There are many risk factors for a UTI. These include having a small, thin   tube to drain pee and not being able to control when you pee or poop.  Treatment includes antibiotic medicines for germs.  Drink enough fluid to keep your pee pale yellow. This information is not intended to replace advice given to you by your health care provider. Make sure you discuss any questions you have with your health care provider. Document Released: 03/06/2008 Document Revised: 09/05/2018 Document Reviewed: 03/28/2018 Elsevier Patient Education  2020 Elsevier Inc.  

## 2019-07-16 LAB — URINALYSIS, MICROSCOPIC ONLY: Casts: NONE SEEN /lpf

## 2019-07-17 ENCOUNTER — Telehealth: Payer: Self-pay

## 2019-07-17 LAB — URINE CULTURE

## 2019-07-17 NOTE — Telephone Encounter (Addendum)
Attempted to contact patient, no answer and voicemail is full.  

## 2019-07-17 NOTE — Telephone Encounter (Signed)
Patient was advised. Patient wants to know why she stopping the antibiotics when she was told in the office that her urine showed some bacteria. She wants tod know if something else could be causing the way she has been feelings? Please advise.

## 2019-07-17 NOTE — Telephone Encounter (Signed)
Noted  

## 2019-07-17 NOTE — Telephone Encounter (Signed)
The dipstick I nthe clinic is not as sensitive as a urine culture.  There was also no true blood in the urine.  We can get false positives.  That is why we confirm.  Can try AZO for discomfort.

## 2019-07-17 NOTE — Telephone Encounter (Signed)
-----   Message from Virginia Crews, MD sent at 07/17/2019 11:53 AM EDT ----- Urine culture shows no bacteria causing UTI.  Can stop antibiotics.

## 2019-07-17 NOTE — Telephone Encounter (Signed)
Patient was advised and states that she doesn't agree with what Dr.B stated. She states that something has to be going on with her because she would not need a heating pad for her pain. Also patient stated how can a dipstick be false if it is dipped into the urine. She states again that something is wrong with her. FYI

## 2019-07-17 NOTE — Telephone Encounter (Signed)
I have not seen patient for this issue. I think she was seen by you for this.

## 2019-08-05 ENCOUNTER — Telehealth: Payer: Self-pay | Admitting: Physician Assistant

## 2019-08-05 ENCOUNTER — Other Ambulatory Visit: Payer: Self-pay | Admitting: Family Medicine

## 2019-08-05 DIAGNOSIS — R300 Dysuria: Secondary | ICD-10-CM

## 2019-08-05 DIAGNOSIS — N3091 Cystitis, unspecified with hematuria: Secondary | ICD-10-CM

## 2019-08-05 MED ORDER — CIPROFLOXACIN HCL 250 MG PO TABS: 250.0000 mg | ORAL_TABLET | Freq: Two times a day (BID) | ORAL | 0 refills | Status: DC

## 2019-08-05 NOTE — Telephone Encounter (Signed)
Patient reports she will drop off urine tomorrow morning.

## 2019-08-05 NOTE — Telephone Encounter (Signed)
Pt has another UTI.  Not wanting to have an appt since her last one was recent. Asking if something can be called in to her pharmcy: [id number] [place] [place] - [address] [phone] (Phone) [phone] (Fax)   Thanks, [facility]

## 2019-08-05 NOTE — Telephone Encounter (Signed)
Please review for Jenni.   Thanks,   -Laura  

## 2019-08-05 NOTE — Telephone Encounter (Signed)
Sent Cipro to the [facility] to use until C&S report comes back. This should help cover for majority of bacteria that usually causes UTI's. If no bacterial growth again, should consider urology evaluation.

## 2019-08-05 NOTE — Telephone Encounter (Signed)
Last culture did not identify any bacteria causing infection and antibiotic was stopped. If symptoms are returning, the concern is a new situation indicating possible infection that needs to be identified to accurately identify so we can eliminate the infection completely. Can start an antibiotic as long as we get a urine specimen to be sure what we are dealing with.

## 2019-08-05 NOTE — Telephone Encounter (Signed)
Last culture did not identify any bacteria causing infection and antibiotic was stopped. If symptoms are returning, the concern is a new situation indicating possible infection that needs to be identified to accurately identify so we can eliminate the infection completely. Can start an antibiotic as long as we get a urine specimen to be sure what we are dealing with.

## 2019-08-06 DIAGNOSIS — R300 Dysuria: Secondary | ICD-10-CM | POA: Diagnosis not present

## 2019-08-06 DIAGNOSIS — N3091 Cystitis, unspecified with hematuria: Secondary | ICD-10-CM | POA: Diagnosis not present

## 2019-08-06 NOTE — Progress Notes (Unsigned)
Patient came by the office this evening to give a urine sample. Urine sample sent to lab for c&s. Awaiting results.

## 2019-08-08 ENCOUNTER — Telehealth: Payer: Self-pay

## 2019-08-08 ENCOUNTER — Other Ambulatory Visit: Payer: Self-pay | Admitting: Family Medicine

## 2019-08-08 DIAGNOSIS — R300 Dysuria: Secondary | ICD-10-CM

## 2019-08-08 LAB — URINE CULTURE

## 2019-08-08 MED ORDER — CIPROFLOXACIN HCL 250 MG PO TABS: 250.0000 mg | ORAL_TABLET | Freq: Two times a day (BID) | ORAL | 0 refills | Status: DC

## 2019-08-08 NOTE — Telephone Encounter (Signed)
-----   Message from [place], [place] sent at 08/08/2019  7:45 AM EST ----- Minimal mixed bacteria on culture that should be cleared by the Cipro. If symptoms persist, should continue antibiotic for 3 more days. Continue to drink extra water to flush out urinary tract.

## 2019-08-08 NOTE — Telephone Encounter (Signed)
Patient was notified of results. Patient is still having symptoms and would like 3 more days of antibiotic sent to pharmacy.

## 2019-08-08 NOTE — Telephone Encounter (Signed)
Attempted call, no answer, voicemail full.

## 2019-08-19 ENCOUNTER — Telehealth: Payer: Self-pay | Admitting: Physician Assistant

## 2019-08-19 NOTE — Telephone Encounter (Signed)
Patient states that she has an appointment scheduled tomorrow morning with Tawanna Sat. KW

## 2019-08-19 NOTE — Telephone Encounter (Signed)
Any risk of covid exposure? Does she wear a mask at work consistently? If her employer requires she, or if she may have been exposed, she should go to the visitor's entrance of [facility] for testing between 10-3p. It may be best for her to do a virtual visit to see what symptoms she is having or if she needs further instructions.

## 2019-08-19 NOTE — Telephone Encounter (Signed)
Pt following up on request for AN APPT.  Pt states she thought she made that clear this morning. Pt needs a virtual appt in order to return to work. Pt declines to see anyone else but Kyle.  Pt needs a call back asap, as she needs to report to her nurse at work her plan of action.

## 2019-08-19 NOTE — Telephone Encounter (Signed)
°  Relation to pt: self  Call back number: [phone]   Reason for call:  Patient called in today due to her not feeling well, patient experiencing stuffy/runny nose no other symptoms noted, patient was advised to inform PCP and seeking clinical advice prior to returning back to work.

## 2019-08-19 NOTE — Telephone Encounter (Signed)
From [facility], please review 

## 2019-08-20 ENCOUNTER — Other Ambulatory Visit: Payer: Self-pay

## 2019-08-20 ENCOUNTER — Encounter: Payer: Self-pay | Admitting: Physician Assistant

## 2019-08-20 ENCOUNTER — Ambulatory Visit (INDEPENDENT_AMBULATORY_CARE_PROVIDER_SITE_OTHER): Payer: BC Managed Care – PPO | Admitting: Physician Assistant

## 2019-08-20 DIAGNOSIS — J3489 Other specified disorders of nose and nasal sinuses: Secondary | ICD-10-CM

## 2019-08-20 DIAGNOSIS — R0981 Nasal congestion: Secondary | ICD-10-CM

## 2019-08-20 DIAGNOSIS — R05 Cough: Secondary | ICD-10-CM

## 2019-08-20 DIAGNOSIS — Z20822 Contact with and (suspected) exposure to covid-19: Secondary | ICD-10-CM

## 2019-08-20 DIAGNOSIS — R059 Cough, unspecified: Secondary | ICD-10-CM

## 2019-08-20 NOTE — Patient Instructions (Signed)

## 2019-08-20 NOTE — Progress Notes (Addendum)
Patient: Marie Foley Female    DOB: 12-29-1955   63 y.o.   MRN: [id number] Visit Date: 08/20/2019  Today's Provider: Margaretann Loveless, PA-C   No chief complaint on file.  Subjective:    Virtual Visit via Video Note  I connected with Marie Foley on 08/20/19 at  8:40 AM EST by a video enabled telemedicine application and verified that I am speaking with the correct person using two identifiers.  Location: Patient: Home Provider: [facility]   I discussed the limitations of evaluation and management by telemedicine and the availability of in person appointments. The patient expressed understanding and agreed to proceed.  Margaretann Loveless, PA-C  Sinusitis This is a new problem. The current episode started yesterday. The problem has been gradually worsening since onset. There has been no fever. Her pain is at a severity of 2/10. Associated symptoms include congestion, coughing, headaches, a hoarse voice (scratchy) and sinus pressure. Pertinent negatives include no chills, diaphoresis, ear pain, neck pain, shortness of breath, sneezing, sore throat or swollen glands. Treatments tried: flonase. The treatment provided no relief.  Patient does work at [employer]. Does not know if she may have been exposed to Covid.   Allergies  Allergen Reactions  . Bactrim [Sulfamethoxazole-Trimethoprim] Nausea And Vomiting  . Diclofenac-Misoprostol   . Macrobid [Nitrofurantoin Macrocrystal] Nausea And Vomiting     Current Outpatient Medications:  .  ALPRAZolam (XANAX) 0.5 MG tablet, TAKE 1 TABLET (0.5 MG TOTAL) BY MOUTH EVERY 8 (EIGHT) HOURS AS NEEDED. FOR ANXIETY, Disp: 90 tablet, Rfl: 5 .  ciprofloxacin (CIPRO) 250 MG tablet, Take 1 tablet (250 mg total) by mouth 2 (two) times daily., Disp: 6 tablet, Rfl: 0 .  conjugated estrogens (PREMARIN) vaginal cream, Place 1 Applicatorful vaginally daily. May decrease to three times per week, then once weekly as symptoms improve, Disp:  42.5 g, Rfl: 12 .  FLUoxetine (PROZAC) 40 MG capsule, TAKE 1 CAPSULE (40 MG TOTAL) BY MOUTH DAILY., Disp: 90 capsule, Rfl: 1 .  fluticasone (FLONASE) 50 MCG/ACT nasal spray, PLACE 1 SPRAY INTO BOTH NOSTRILS 2 (TWO) TIMES DAILY., Disp: 48 mL, Rfl: 1 .  Fluticasone-Salmeterol (ADVAIR DISKUS) 250-50 MCG/DOSE AEPB, Inhale 1 puff into the lungs as needed. , Disp: , Rfl:  .  phenazopyridine (PYRIDIUM) 200 MG tablet, Take 1 tablet (200 mg total) by mouth 3 (three) times daily as needed for pain., Disp: 15 tablet, Rfl: 0  Review of Systems  Constitutional: Negative for chills, diaphoresis and fever.  HENT: Positive for congestion, hoarse voice (scratchy) and sinus pressure. Negative for ear pain, sneezing and sore throat.   Respiratory: Positive for cough. Negative for shortness of breath.   Musculoskeletal: Negative for neck pain.  Neurological: Positive for headaches.    Social History   Tobacco Use  . Smoking status: Former Smoker    Quit date: 10/01/1998    Years since quitting: 20.8  . Smokeless tobacco: Never Used  Substance Use Topics  . Alcohol use: Yes    Comment: ocassional      Objective:   There were no vitals taken for this visit. There were no vitals filed for this visit.There is no height or weight on file to calculate BMI.   Physical Exam Vitals signs reviewed.  Constitutional:      General: She is not in acute distress. Pulmonary:     Effort: No respiratory distress.  Neurological:     Mental Status: She is alert.  No results found for any visits on 08/20/19.     Assessment & Plan    1. Nasal congestion New onset on Tuesday (08/19/19). Does get seasonal sinus infections/congestion. Will test for covid as below. Advised to isolate and stay home until covid test results are received. Discussed using OTC cold and flu medications of choice and as needed. Use Tylenol and IBU alternating every 3 hours if needed for fever and body aches. Call if symptoms  worsen. Work note provided and faxed today.  - Novel Coronavirus, NAA ([facility])  2. Cough See above medical treatment plan. - Novel Coronavirus, NAA ([facility])  3. Sinus pressure See above medical treatment plan. - Novel Coronavirus, NAA ([facility])    I discussed the assessment and treatment plan with the patient. The patient was provided an opportunity to ask questions and all were answered. The patient agreed with the plan and demonstrated an understanding of the instructions.   The patient was advised to call back or seek an in-person evaluation if the symptoms worsen or if the condition fails to improve as anticipated.  I provided 13 minutes of non-face-to-face time during this encounter.  Mar Daring, PA-C  [facility]

## 2019-08-21 LAB — NOVEL CORONAVIRUS, NAA: SARS-CoV-2, NAA: NOT DETECTED

## 2019-08-22 ENCOUNTER — Telehealth: Payer: Self-pay

## 2019-08-22 ENCOUNTER — Telehealth: Payer: Self-pay | Admitting: Physician Assistant

## 2019-08-22 DIAGNOSIS — J0140 Acute pansinusitis, unspecified: Secondary | ICD-10-CM

## 2019-08-22 MED ORDER — AMOXICILLIN-POT CLAVULANATE 875-125 MG PO TABS: 1.0000 | ORAL_TABLET | Freq: Two times a day (BID) | ORAL | 0 refills | Status: DC

## 2019-08-22 NOTE — Telephone Encounter (Signed)
Attempted to call pt. Back; no answer; mailbox full.

## 2019-08-22 NOTE — Telephone Encounter (Signed)
Work note printed.  

## 2019-08-22 NOTE — Telephone Encounter (Signed)
-----   Message from Mar Daring, [place] sent at 08/22/2019  8:18 AM EST ----- Covid testing is negative. I will send a letter stating that you can return to work on Monday if you have been without fever x 3 days. I will also send in Augmentin for sinus infection if you are still symptomatic.

## 2019-08-22 NOTE — Telephone Encounter (Signed)
Pt calling back and would like a nurse to call back as soon as possible.

## 2019-08-22 NOTE — Telephone Encounter (Signed)
Patient advised as directed below. 

## 2019-08-22 NOTE — Telephone Encounter (Signed)
Advised by an Agent that pt. Was calling to inquire about why the Provider called in an antibiotic.  Agent was advised by office to have the pt. Speak to Triage, and to give negative COVID results, but to let pt. Know that Fenton Malling wants her to take an antibiotic.  The call got dropped.  Unable to get pt. Back on phone; no message was left, as she has no voice mail set up.

## 2019-08-25 NOTE — Telephone Encounter (Signed)
Faxed on Friday to the following fax number [phone]

## 2019-08-25 NOTE — Telephone Encounter (Signed)
Pt would like you to fax he letter to her nurse at work.  Pt states Marie Foley has the fax number, as she has sent info to the nurse already.  The nurse at her work has not received anything yet.    Pt states they also need copy of negative results. Please call if you have any questions.

## 2019-09-03 ENCOUNTER — Other Ambulatory Visit: Payer: Self-pay | Admitting: Physician Assistant

## 2019-09-03 DIAGNOSIS — F411 Generalized anxiety disorder: Secondary | ICD-10-CM

## 2019-09-03 NOTE — Telephone Encounter (Signed)
Requested medication (s) are due for refill today: yes  Requested medication (s) are on the active medication list: yes  Last refill: 07/25/2019  Future visit scheduled: no  Notes to clinic:  Not delegated    Requested Prescriptions  Pending Prescriptions Disp Refills   ALPRAZolam (XANAX) 0.5 MG tablet [Pharmacy Med Name: ALPRAZOLAM 0.5 MG TABLET] 90 tablet     Sig: TAKE 1 TABLET (0.5 MG TOTAL) BY MOUTH EVERY 8 (EIGHT) HOURS AS NEEDED. FOR ANXIETY     Not Delegated - Psychiatry:  Anxiolytics/Hypnotics Failed - 09/03/2019 10:11 AM      Failed - This refill cannot be delegated      Failed - Urine Drug Screen completed in last 360 days.      Passed - Valid encounter within last 6 months    Recent Outpatient Visits          2 weeks ago Nasal congestion   [place], [place]   1 month ago Cystitis with hematuria   [facility] [place], Dionne Bucy, [place]   7 months ago Acute cystitis without hematuria   [place], Clearnce Sorrel, [place]   1 year ago Annual physical exam   [place], [place]   2 years ago Acute cystitis with hematuria   [facility] [place], [place], [place]

## 2019-10-23 ENCOUNTER — Encounter: Payer: Self-pay | Admitting: Physician Assistant

## 2019-10-23 ENCOUNTER — Ambulatory Visit (INDEPENDENT_AMBULATORY_CARE_PROVIDER_SITE_OTHER): Payer: BC Managed Care – PPO | Admitting: Physician Assistant

## 2019-10-23 ENCOUNTER — Other Ambulatory Visit: Payer: Self-pay

## 2019-10-23 DIAGNOSIS — M5431 Sciatica, right side: Secondary | ICD-10-CM

## 2019-10-23 MED ORDER — METHYLPREDNISOLONE 4 MG PO TBPK: ORAL_TABLET | ORAL | 0 refills | Status: DC

## 2019-10-23 MED ORDER — CYCLOBENZAPRINE HCL 5 MG PO TABS: 5.0000 mg | ORAL_TABLET | Freq: Three times a day (TID) | ORAL | 0 refills | Status: DC | PRN

## 2019-10-23 NOTE — Patient Instructions (Signed)

## 2019-10-23 NOTE — Progress Notes (Signed)
Patient: Marie Foley Female    DOB: July 17, 1956   64 y.o.   MRN: [id number] Visit Date: 10/23/2019  Today's Provider: Margaretann Loveless, PA-C   No chief complaint on file.  Subjective:     Virtual Visit via Telephone Note  I connected with KRISTIAN HAZZARD on 10/23/19 at 11:20 AM EST by telephone and verified that I am speaking with the correct person using two identifiers.  Location: Patient: Home Provider: Home office in [place]   I discussed the limitations, risks, security and privacy concerns of performing an evaluation and management service by telephone and the availability of in person appointments. I also discussed with the patient that there may be a patient responsible charge related to this service. The patient expressed understanding and agreed to proceed.   Back Pain This is a new problem. The current episode started 1 to 4 weeks ago. The problem is unchanged. The pain is present in the lumbar spine. Radiates to: Right leg. Exacerbated by: Movement, position changes. Associated symptoms include leg pain and weakness. Pertinent negatives include no bladder incontinence, bowel incontinence, numbness, paresis, paresthesias, perianal numbness or tingling. She has tried heat and ice for the symptoms.    Allergies  Allergen Reactions  . Bactrim [Sulfamethoxazole-Trimethoprim] Nausea And Vomiting  . Diclofenac-Misoprostol   . Macrobid [Nitrofurantoin Macrocrystal] Nausea And Vomiting     Current Outpatient Medications:  .  ALPRAZolam (XANAX) 0.5 MG tablet, TAKE 1 TABLET (0.5 MG TOTAL) BY MOUTH EVERY 8 (EIGHT) HOURS AS NEEDED. FOR ANXIETY, Disp: 90 tablet, Rfl: 5 .  amoxicillin-clavulanate (AUGMENTIN) 875-125 MG tablet, Take 1 tablet by mouth 2 (two) times daily., Disp: 20 tablet, Rfl: 0 .  ciprofloxacin (CIPRO) 250 MG tablet, Take 1 tablet (250 mg total) by mouth 2 (two) times daily., Disp: 6 tablet, Rfl: 0 .  conjugated estrogens (PREMARIN) vaginal  cream, Place 1 Applicatorful vaginally daily. May decrease to three times per week, then once weekly as symptoms improve, Disp: 42.5 g, Rfl: 12 .  FLUoxetine (PROZAC) 40 MG capsule, TAKE 1 CAPSULE (40 MG TOTAL) BY MOUTH DAILY., Disp: 90 capsule, Rfl: 1 .  fluticasone (FLONASE) 50 MCG/ACT nasal spray, PLACE 1 SPRAY INTO BOTH NOSTRILS 2 (TWO) TIMES DAILY., Disp: 48 mL, Rfl: 1 .  Fluticasone-Salmeterol (ADVAIR DISKUS) 250-50 MCG/DOSE AEPB, Inhale 1 puff into the lungs as needed. , Disp: , Rfl:  .  phenazopyridine (PYRIDIUM) 200 MG tablet, Take 1 tablet (200 mg total) by mouth 3 (three) times daily as needed for pain., Disp: 15 tablet, Rfl: 0  Review of Systems  Constitutional: Negative.   Respiratory: Negative.   Cardiovascular: Negative.   Gastrointestinal: Negative.  Negative for bowel incontinence.  Genitourinary: Negative.  Negative for bladder incontinence.  Musculoskeletal: Positive for back pain.  Neurological: Positive for weakness. Negative for tingling, numbness and paresthesias.    Social History   Tobacco Use  . Smoking status: Former Smoker    Quit date: 10/01/1998    Years since quitting: 21.0  . Smokeless tobacco: Never Used  Substance Use Topics  . Alcohol use: Yes    Comment: ocassional      Objective:   There were no vitals taken for this visit. There were no vitals filed for this visit.There is no height or weight on file to calculate BMI.   Physical Exam Vitals reviewed.  Constitutional:      General: She is not in acute distress. Pulmonary:     Effort:  No respiratory distress.  Neurological:     Mental Status: She is alert.      No results found for any visits on 10/23/19.     Assessment & Plan    1. Sciatica of right side Discussed conservative management with ice, heat, massage. Will add flexeril and medrol dose pak as below. Patient cannot take NSAIDs due to esophagitis. Call if worsening or not improving. May consider imaging.  -  cyclobenzaprine (FLEXERIL) 5 MG tablet; Take 1 tablet (5 mg total) by mouth 3 (three) times daily as needed for muscle spasms.  Dispense: 30 tablet; Refill: 0 - methylPREDNISolone (MEDROL) 4 MG TBPK tablet; 6 day taper; take as directed on package instructions  Dispense: 21 tablet; Refill: 0   I discussed the assessment and treatment plan with the patient. The patient was provided an opportunity to ask questions and all were answered. The patient agreed with the plan and demonstrated an understanding of the instructions.   The patient was advised to call back or seek an in-person evaluation if the symptoms worsen or if the condition fails to improve as anticipated.  I provided 10 minutes of non-face-to-face time during this encounter.    Mar Daring, PA-C  [facility]

## 2019-12-29 ENCOUNTER — Other Ambulatory Visit: Payer: Self-pay | Admitting: Physician Assistant

## 2019-12-29 DIAGNOSIS — M5431 Sciatica, right side: Secondary | ICD-10-CM

## 2019-12-29 NOTE — Telephone Encounter (Signed)
Requested medication (s) are due for refill today: Methylprednisolone, yes  Requested medication (s) are on the active medication list: yes  Last refill:  10/23/19  Future visit scheduled: no  Notes to clinic:  not delegated  Requested medication (s) are due for refill today: Cyclobenzaprine, yes  Requested medication (s) are on the active medication list: yes  Last refill:  10/23/19  Future visit scheduled: no  Notes to clinic:  not delegated  Requested Prescriptions  Pending Prescriptions Disp Refills   methylPREDNISolone (MEDROL) 4 MG TBPK tablet 21 tablet 0    Sig: 6 day taper; take as directed on package instructions      Not Delegated - Endocrinology:  Oral Corticosteroids Failed - 12/29/2019  8:39 AM      Failed - This refill cannot be delegated      Passed - Last BP in normal range    BP Readings from Last 1 Encounters:  07/15/19 133/79          Passed - Valid encounter within last 6 months    Recent Outpatient Visits           2 months ago Sciatica of right side   [facility] [place], [place], [place]   4 months ago Nasal congestion   [facility] [place], [place], [place]   5 months ago Cystitis with hematuria   [facility] [place], Marzella Schlein, [place]   11 months ago Acute cystitis without hematuria   [facility] [place], Alessandra [place], [place]   1 year ago Annual physical exam   [facility] Joycelyn Man M, PA-C                cyclobenzaprine (FLEXERIL) 5 MG tablet 30 tablet 0    Sig: Take 1 tablet (5 mg total) by mouth 3 (three) times daily as needed for muscle spasms.      Not Delegated - Analgesics:  Muscle Relaxants Failed - 12/29/2019  8:39 AM      Failed - This refill cannot be delegated      Passed - Valid encounter within last 6 months    Recent Outpatient Visits           2 months ago Sciatica of right side   [facility] [place], [place], [place]    4 months ago Nasal congestion   [facility] [place], [place], [place]   5 months ago Cystitis with hematuria   [facility] [place], Marzella Schlein, [place]   11 months ago Acute cystitis without hematuria   [facility], Alessandra [place], [place]   1 year ago Annual physical exam   [facility] Joycelyn Man [place], [place]

## 2019-12-29 NOTE — Telephone Encounter (Signed)
Medication refill: methylPREDNISolone (MEDROL) 4 MG TBPK tablet [[id number]] cyclobenzaprine (FLEXERIL) 5 MG tablet [[id number]]      Pt called and stated that she was told to call medication back in if she did not get better.      Pharmacy:  [facility] [address], [place] - [address] Phone:  [phone]  Fax:  [phone]

## 2019-12-31 ENCOUNTER — Telehealth: Payer: Self-pay | Admitting: Physician Assistant

## 2019-12-31 DIAGNOSIS — M5431 Sciatica, right side: Secondary | ICD-10-CM

## 2019-12-31 NOTE — Telephone Encounter (Signed)
Patient called in trying to get her medication refilled that was recently refused . Was told by Joycelyn Man to call if pain worsens    methylPREDNISolone (MEDROL) 4 MG TBPK tablet [[id number]]

## 2020-01-01 MED ORDER — METHYLPREDNISOLONE 4 MG PO TBPK: ORAL_TABLET | ORAL | 0 refills | Status: DC

## 2020-01-01 NOTE — Telephone Encounter (Signed)
refilled 

## 2020-01-26 ENCOUNTER — Telehealth: Payer: Self-pay

## 2020-01-26 NOTE — Telephone Encounter (Signed)
Patient advised that her letter is ready per patient if Antony Contras can email her supervisor at [email or web address] to let her know that she is ok to go back to work. Patient stated Antony Contras has done this before for her. She also wants the Dr. To put on the letter that she should get pay for the days that she missed because of covid and she had a reaction to the vaccine.

## 2020-01-26 NOTE — Telephone Encounter (Signed)
Copied from CRM [id number]. Topic: General - Other >> Jan 26, 2020 12:02 PM Marie Foley wrote: Reason for CRM: Pt called stating that she received the 2nd dose of the covid vaccine. Pt states that she did have a reaction, chills, fever, and body aches. Pt states that she does feel better, but now her employer is requesting to have a letter stating that she can go back to work. Please advise.

## 2020-01-26 NOTE — Telephone Encounter (Signed)
I cannot put that she should get paid as that is not my decision to make, but I can put that she was out for the reaction to the vaccine. Which days did she miss?

## 2020-01-26 NOTE — Telephone Encounter (Signed)
emailed

## 2020-01-26 NOTE — Telephone Encounter (Signed)
Letter reprinted. OK to email through fax.

## 2020-01-26 NOTE — Telephone Encounter (Signed)
She said she only miss today

## 2020-01-26 NOTE — Telephone Encounter (Signed)
Patient returning call to check status of work note. Requesting call back.

## 2020-01-26 NOTE — Telephone Encounter (Signed)
Work note printed.  

## 2020-01-27 ENCOUNTER — Telehealth: Payer: Self-pay

## 2020-01-27 NOTE — Telephone Encounter (Signed)
Patient came by front desk wanting a copy of the letter that was supposed to be emailed.  She said the letter must have been emailed to an incorrect email.  Pt wants a call back to find out what email her letter was sent to.  She said her letter was to include why she was out was due to covid like symptoms.  This was not included in her letter.  Please call patient back to discuss asap.  Thanks, [employer]

## 2020-01-27 NOTE — Telephone Encounter (Signed)
This letter was emailed yesterday and today twice.

## 2020-01-27 NOTE — Telephone Encounter (Signed)
Copied from CRM [id number]. 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 Please advise

## 2020-04-07 ENCOUNTER — Other Ambulatory Visit: Payer: Self-pay | Admitting: Physician Assistant

## 2020-04-07 DIAGNOSIS — F411 Generalized anxiety disorder: Secondary | ICD-10-CM

## 2020-04-07 DIAGNOSIS — F419 Anxiety disorder, unspecified: Secondary | ICD-10-CM

## 2020-04-07 NOTE — Telephone Encounter (Signed)
Requested Prescriptions  Pending Prescriptions Disp Refills  . FLUoxetine (PROZAC) 40 MG capsule [Pharmacy Med Name: FLUOXETINE HCL 40 MG CAPSULE] 30 capsule 0    Sig: TAKE 1 CAPSULE (40 MG TOTAL) BY MOUTH DAILY.     Psychiatry:  Antidepressants - SSRI Failed - 04/07/2020  1:31 AM      Failed - Completed PHQ-2 or PHQ-9 in the last 360 days.      Failed - Valid encounter within last 6 months    Recent Outpatient Visits          5 months ago Sciatica of right side   [facility] [place], [place], [place]   7 months ago Nasal congestion   [facility] [place], [place], [place]   8 months ago Cystitis with hematuria   [facility] [place], Marzella Schlein, [place]   1 year ago Acute cystitis without hematuria   [facility] [place], Alessandra [place], [place]   1 year ago Annual physical exam   [facility] Joycelyn Man [place], [place]             One month supply provided with reminder for patient to schedule a 6 month f/u appointment.  Patient also needing PHQ-9 updated.

## 2020-04-07 NOTE — Telephone Encounter (Signed)
Requested medication (s) are due for refill today: yes  Requested medication (s) are on the active medication list: yes   Last refill: 09/03/2019  #90  5 refills  Future visit scheduled no  Notes to clinic: not delegated Requested Prescriptions  Pending Prescriptions Disp Refills   ALPRAZolam (XANAX) 0.5 MG tablet [Pharmacy Med Name: ALPRAZOLAM 0.5 MG TABLET] 90 tablet     Sig: TAKE 1 TABLET (0.5 MG TOTAL) BY MOUTH EVERY 8 (EIGHT) HOURS AS NEEDED. FOR ANXIETY      Not Delegated - Psychiatry:  Anxiolytics/Hypnotics Failed - 04/07/2020  9:43 AM      Failed - This refill cannot be delegated      Failed - Urine Drug Screen completed in last 360 days.      Failed - Valid encounter within last 6 months    Recent Outpatient Visits           5 months ago Sciatica of right side   [facility] [place], [place], [place]   7 months ago Nasal congestion   [facility] [place], [place], [place]   8 months ago Cystitis with hematuria   [facility] [place], Marzella Schlein, [place]   1 year ago Acute cystitis without hematuria   [facility] [place], Alessandra [place]vel[place], PA-C   1 year ago Annual physical exam   [facility] Margaretann Loveless, [place]

## 2020-04-11 ENCOUNTER — Other Ambulatory Visit: Payer: Self-pay | Admitting: Physician Assistant

## 2020-04-11 DIAGNOSIS — F419 Anxiety disorder, unspecified: Secondary | ICD-10-CM

## 2020-08-16 ENCOUNTER — Telehealth: Payer: Self-pay

## 2020-08-16 DIAGNOSIS — Z1239 Encounter for other screening for malignant neoplasm of breast: Secondary | ICD-10-CM

## 2020-08-16 NOTE — Telephone Encounter (Signed)
Copied from CRM [id number]. Topic: Quick Communication - See Telephone Encounter >> Aug 16, 2020  2:59 PM Aretta Nip wrote: CRM for notification. See Telephone encounter for: 08/16/20.pt had missed her mammogram last year and needs an order sent tp [facility] for an appt and wants them to call her with an appt at [phone].

## 2020-08-16 NOTE — Telephone Encounter (Signed)
Mammogram ordered. She can call to schedule. [facility]: [phone]

## 2020-08-17 NOTE — Telephone Encounter (Signed)
Patient advised as directed below. 

## 2020-08-19 ENCOUNTER — Other Ambulatory Visit: Payer: Self-pay | Admitting: Physician Assistant

## 2020-08-19 DIAGNOSIS — F419 Anxiety disorder, unspecified: Secondary | ICD-10-CM

## 2020-08-19 NOTE — Telephone Encounter (Signed)
Requested medication (s) are due for refill today: no  Requested medication (s) are on the active medication list: yes  Last refill: 08/01/2020  Future visit scheduled:no  Notes to clinic: over due for follow up appointment Tried to contact patient no answer and vm is full    Requested Prescriptions  Pending Prescriptions Disp Refills   FLUoxetine (PROZAC) 40 MG capsule [Pharmacy Med Name: FLUOXETINE HCL 40 MG CAPSULE] 30 capsule 0    Sig: TAKE 1 CAPSULE BY MOUTH EVERY DAY      Psychiatry:  Antidepressants - SSRI Failed - 08/19/2020  8:34 AM      Failed - Completed PHQ-2 or PHQ-9 in the last 360 days      Failed - Valid encounter within last 6 months    Recent Outpatient Visits           10 months ago Sciatica of right side   [facility] [place], [place]andra [place], [place]   1 year ago Nasal congestion   [facility] [place], [place], [place]   1 year ago Cystitis with hematuria   [facility] [place], Marzella Schlein, [place]   1 year ago Acute cystitis without hematuria   [facility] [place], [place], [place]   2 years ago Annual physical exam   [facility] [place], [place], [place]

## 2020-09-06 ENCOUNTER — Ambulatory Visit: Payer: BC Managed Care – PPO | Admitting: Physician Assistant

## 2020-09-06 ENCOUNTER — Other Ambulatory Visit: Payer: Self-pay

## 2020-09-06 ENCOUNTER — Encounter: Payer: Self-pay | Admitting: Physician Assistant

## 2020-09-06 ENCOUNTER — Ambulatory Visit
Admission: RE | Admit: 2020-09-06 | Discharge: 2020-09-06 | Disposition: A | Discharge status: Home / Self Care | Payer: BC Managed Care – PPO | Source: Ambulatory Visit | Attending: Physician Assistant | Admitting: Physician Assistant

## 2020-09-06 ENCOUNTER — Ambulatory Visit: Payer: Self-pay

## 2020-09-06 ENCOUNTER — Ambulatory Visit
Admission: RE | Admit: 2020-09-06 | Discharge: 2020-09-06 | Disposition: A | Discharge status: Home / Self Care | Payer: BC Managed Care – PPO | Attending: Physician Assistant | Admitting: Physician Assistant

## 2020-09-06 VITALS — BP 135/91 | HR 94 | Temp 98.5°F | Ht 67.0 in

## 2020-09-06 DIAGNOSIS — N3091 Cystitis, unspecified with hematuria: Secondary | ICD-10-CM

## 2020-09-06 DIAGNOSIS — S92352A Displaced fracture of fifth metatarsal bone, left foot, initial encounter for closed fracture: Secondary | ICD-10-CM

## 2020-09-06 DIAGNOSIS — F411 Generalized anxiety disorder: Secondary | ICD-10-CM | POA: Diagnosis not present

## 2020-09-06 DIAGNOSIS — M7989 Other specified soft tissue disorders: Secondary | ICD-10-CM | POA: Diagnosis not present

## 2020-09-06 DIAGNOSIS — S92355A Nondisplaced fracture of fifth metatarsal bone, left foot, initial encounter for closed fracture: Secondary | ICD-10-CM | POA: Diagnosis not present

## 2020-09-06 DIAGNOSIS — S99912A Unspecified injury of left ankle, initial encounter: Secondary | ICD-10-CM

## 2020-09-06 LAB — POCT URINALYSIS DIPSTICK
Bilirubin, UA: NEGATIVE
Glucose, UA: NEGATIVE
Ketones, UA: NEGATIVE
Nitrite, UA: POSITIVE
Protein, UA: POSITIVE — AB
Spec Grav, UA: 1.01 (ref 1.010–1.025)
Urobilinogen, UA: 0.2 E.U./dL
pH, UA: 7 (ref 5.0–8.0)

## 2020-09-06 MED ORDER — AMOXICILLIN-POT CLAVULANATE 875-125 MG PO TABS: 1.0000 | ORAL_TABLET | Freq: Two times a day (BID) | ORAL | 0 refills | Status: DC

## 2020-09-06 MED ORDER — IBUPROFEN 600 MG PO TABS: 600.0000 mg | ORAL_TABLET | Freq: Three times a day (TID) | ORAL | 0 refills | Status: DC | PRN

## 2020-09-06 MED ORDER — FLUOXETINE HCL 40 MG PO CAPS: 40.0000 mg | ORAL_CAPSULE | Freq: Every day | ORAL | 3 refills | Status: DC

## 2020-09-06 NOTE — Patient Instructions (Signed)
 Ankle Sprain  An ankle sprain is a stretch or tear in one of the tough tissues (ligaments) that connect the bones in your ankle. An ankle sprain can happen when the ankle rolls outward (inversion sprain) or inward (eversion sprain). What are the causes? This condition is caused by rolling or twisting the ankle. What increases the risk? You are more likely to develop this condition if you play sports. What are the signs or symptoms? Symptoms of this condition include:  Pain in your ankle.  Swelling.  Bruising. This may happen right after you sprain your ankle or 1-2 days later.  Trouble standing or walking. How is this diagnosed? This condition is diagnosed with:  A physical exam. During the exam, your doctor will press on certain parts of your foot and ankle and try to move them in certain ways.  X-ray imaging. These may be taken to see how bad the sprain is and to check for broken bones. How is this treated? This condition may be treated with:  A brace or splint. This is used to keep the ankle from moving until it heals.  An elastic bandage. This is used to support the ankle.  Crutches.  Pain medicine.  Surgery. This may be needed if the sprain is very bad.  Physical therapy. This may help to improve movement in the ankle. Follow these instructions at home: If you have a brace or a splint:  Wear the brace or splint as told by your doctor. Remove it only as told by your doctor.  Loosen the brace or splint if your toes: ? Tingle. ? Lose feeling (become numb). ? Turn cold and blue.  Keep the brace or splint clean.  If the brace or splint is not waterproof: ? Do not let it get wet. ? Cover it with a watertight covering when you take a bath or a shower. If you have an elastic bandage (dressing):  Remove it to shower or bathe.  Try not to move your ankle much, but wiggle your toes from time to time. This helps to prevent swelling.  Adjust the dressing if it  feels too tight.  Loosen the dressing if your foot: ? Loses feeling. ? Tingles. ? Becomes cold and blue. Managing pain, stiffness, and swelling   Take over-the-counter and prescription medicines only as told by doctor.  For 2-3 days, keep your ankle raised (elevated) above the level of your heart.  If told, put ice on the injured area: ? If you have a removable brace or splint, remove it as told by your doctor. ? Put ice in a plastic bag. ? Place a towel between your skin and the bag. ? Leave the ice on for 20 minutes, 2-3 times a day. General instructions  Rest your ankle.  Do not use your injured leg to support your body weight until your doctor says that you can. Use crutches as told by your doctor.  Do not use any products that contain nicotine or tobacco, such as cigarettes, e-cigarettes, and chewing tobacco. If you need help quitting, ask your doctor.  Keep all follow-up visits as told by your doctor. Contact a doctor if:  Your bruises or swelling are quickly getting worse.  Your pain does not get better after you take medicine. Get help right away if:  You cannot feel your toes or foot.  Your foot or toes look blue.  You have very bad pain that gets worse. Summary  An ankle sprain is a   stretch or tear in one of the tough tissues (ligaments) that connect the bones in your ankle.  This condition is caused by rolling or twisting the ankle.  Symptoms include pain, swelling, bruising, and trouble walking.  To help with pain and swelling, put ice on the injured ankle, raise your ankle above the level of your heart, and use an elastic bandage. Also, rest as told by your doctor.  Keep all follow-up visits as told by your doctor. This is important. This information is not intended to replace advice given to you by your health care provider. Make sure you discuss any questions you have with your health care provider. Document Revised: 02/12/2018 Document Reviewed:  02/12/2018 Elsevier Patient Education  2020 [employer]   Ankle Sprain, Phase I Rehab An ankle sprain is an injury to the ligaments of your ankle. Ankle sprains cause stiffness, loss of motion, and loss of strength. Ask your health care provider which exercises are safe for you. Do exercises exactly as told by your health care provider and adjust them as directed. It is normal to feel mild stretching, pulling, tightness, or discomfort as you do these exercises. Stop right away if you feel sudden pain or your pain gets worse. Do not begin these exercises until told by your health care provider. Stretching and range-of-motion exercises These exercises warm up your muscles and joints and improve the movement and flexibility of your lower leg and ankle. These exercises also help to relieve pain and stiffness. Gastroc and soleus stretch This exercise is also called a calf stretch. It stretches the muscles in the back of the lower leg. These muscles are the gastrocnemius, or gastroc, and the soleus. 1. Sit on the floor with your left / right leg extended. 2. Loop a belt or towel around the ball of your left / right foot. The ball of your foot is on the walking surface, right under your toes. 3. Keep your left / right ankle and foot relaxed and keep your knee straight while you use the belt or towel to pull your foot toward you. You should feel a gentle stretch behind your calf or knee in your gastroc muscle. 4. Hold this position for __________ seconds, then release to the starting position. 5. Repeat the exercise with your knee bent. You can put a pillow or a rolled bath towel under your knee to support it. You should feel a stretch deep in your calf in the soleus muscle or at your Achilles tendon. Repeat __________ times. Complete this exercise __________ times a day. Ankle alphabet  1. Sit with your left / right leg supported at the lower leg. ? Do not rest your foot on anything. ? Make sure your  foot has room to move freely. 2. Think of your left / right foot as a paintbrush. ? Move your foot to trace each letter of the alphabet in the air. Keep your hip and knee still while you trace. ? Make the letters as large as you can without feeling discomfort. 3. Trace every letter from A to Z. Repeat __________ times. Complete this exercise __________ times a day. Strengthening exercises These exercises build strength and endurance in your ankle and lower leg. Endurance is the ability to use your muscles for a long time, even after they get tired. Ankle dorsiflexion  1. Secure a rubber exercise band or tube to an object, such as a table leg, that will stay still when the band is pulled. Secure the other   end around your left / right foot. 2. Sit on the floor facing the object, with your left / right leg extended. The band or tube should be slightly tense when your foot is relaxed. 3. Slowly bring your foot toward you, bringing the top of your foot toward your shin (dorsiflexion), and pulling the band tighter. 4. Hold this position for __________ seconds. 5. Slowly return your foot to the starting position. Repeat __________ times. Complete this exercise __________ times a day. Ankle plantar flexion  1. Sit on the floor with your left / right leg extended. 2. Loop a rubber exercise tube or band around the ball of your left / right foot. The ball of your foot is on the walking surface, right under your toes. ? Hold the ends of the band or tube in your hands. ? The band or tube should be slightly tense when your foot is relaxed. 3. Slowly point your foot and toes downward to tilt the top of your foot away from your shin (plantar flexion). 4. Hold this position for __________ seconds. 5. Slowly return your foot to the starting position. Repeat __________ times. Complete this exercise __________ times a day. Ankle eversion 1. Sit on the floor with your legs straight out in front of  you. 2. Loop a rubber exercise band or tube around the ball of your left / right foot. The ball of your foot is on the walking surface, right under your toes. ? Hold the ends of the band in your hands, or secure the band to a stable object. ? The band or tube should be slightly tense when your foot is relaxed. 3. Slowly push your foot outward, away from your other leg (eversion). 4. Hold this position for __________ seconds. 5. Slowly return your foot to the starting position. Repeat __________ times. Complete this exercise __________ times a day. This information is not intended to replace advice given to you by your health care provider. Make sure you discuss any questions you have with your health care provider. Document Revised: 01/07/2019 Document Reviewed: 07/01/2018 Elsevier Patient Education  2020 Elsevier Inc.  

## 2020-09-06 NOTE — Telephone Encounter (Signed)
FYI. Thanks.

## 2020-09-06 NOTE — Progress Notes (Signed)
Established patient visit   Patient: Marie Foley   DOB: 12-Jun-1956   64 y.o. Female  MRN: [id number] Visit Date: 09/06/2020  Today's healthcare provider: Margaretann Loveless, PA-C   Chief Complaint  Patient presents with  . Ankle Injury   Subjective    Ankle Injury  The incident occurred 2 days ago. The injury mechanism was a twisting injury and a fall. The pain is present in the left ankle. The quality of the pain is described as aching and burning. The pain has been constant since onset. Associated symptoms include an inability to bear weight. Pertinent negatives include no loss of motion, loss of sensation, muscle weakness, numbness or tingling. The symptoms are aggravated by weight bearing, movement and palpation. She has tried ice, acetaminophen and heat for the symptoms. The treatment provided no relief.      Patient Active Problem List   Diagnosis Date Noted  . Elevated hemoglobin A1c 07/10/2018  . Hypercholesterolemia 07/10/2018  . Allergic rhinitis 10/25/2015  . Clinical depression 10/25/2015  . Blood in the urine 10/25/2015  . RAD (reactive airway disease) 10/25/2015  . Avitaminosis D 10/25/2015  . Calcification of left breast 07/27/2015  . Anxiety disorder 04/26/2015  . Urinary calculus 07/19/2007   No past medical history on file. Social History   Tobacco Use  . Smoking status: Former Smoker    Quit date: 10/01/1998    Years since quitting: 21.9  . Smokeless tobacco: Never Used  Substance Use Topics  . Alcohol use: Yes    Comment: ocassional  . Drug use: No   Allergies  Allergen Reactions  . Bactrim [Sulfamethoxazole-Trimethoprim] Nausea And Vomiting  . Diclofenac-Misoprostol   . Macrobid [Nitrofurantoin Macrocrystal] Nausea And Vomiting     Medications: Outpatient Medications Prior to Visit  Medication Sig  . ALPRAZolam (XANAX) 0.5 MG tablet TAKE 1 TABLET (0.5 MG TOTAL) BY MOUTH EVERY 8 (EIGHT) HOURS AS NEEDED. FOR ANXIETY  . conjugated  estrogens (PREMARIN) vaginal cream Place 1 Applicatorful vaginally daily. May decrease to three times per week, then once weekly as symptoms improve  . cyclobenzaprine (FLEXERIL) 5 MG tablet Take 1 tablet (5 mg total) by mouth 3 (three) times daily as needed for muscle spasms.  Marland Kitchen FLUoxetine (PROZAC) 40 MG capsule TAKE 1 CAPSULE (40 MG TOTAL) BY MOUTH DAILY.  . fluticasone (FLONASE) 50 MCG/ACT nasal spray PLACE 1 SPRAY INTO BOTH NOSTRILS 2 (TWO) TIMES DAILY.  Marland Kitchen Fluticasone-Salmeterol (ADVAIR DISKUS) 250-50 MCG/DOSE AEPB Inhale 1 puff into the lungs as needed.   . methylPREDNISolone (MEDROL) 4 MG TBPK tablet 6 day taper; take as directed on package instructions   No facility-administered medications prior to visit.    Review of Systems  Constitutional: Negative.   Respiratory: Negative.   Cardiovascular: Negative.   Musculoskeletal: Positive for arthralgias, gait problem and joint swelling. Negative for back pain, myalgias, neck pain and neck stiffness.  Neurological: Negative for tingling, weakness and numbness.      Objective    BP (!) 135/91 (BP Location: Left Arm, Patient Position: Sitting, Cuff Size: Large)   Pulse 94   Temp 98.5 F (36.9 C) (Oral)   Ht 5\' 7"  (1.702 m)   BMI 36.02 kg/m    Physical Exam Vitals reviewed.  Constitutional:      Appearance: Normal appearance. She is well-developed.  HENT:     Head: Normocephalic and atraumatic.  Pulmonary:     Effort: Pulmonary effort is normal. No respiratory distress.  Musculoskeletal:  Cervical back: Normal range of motion and neck supple.     Left lower leg: Edema present.     Left ankle: Swelling and ecchymosis present. No deformity. Tenderness present over the lateral malleolus, ATF ligament, AITF ligament, CF ligament and base of 5th metatarsal. No proximal fibula tenderness. Decreased range of motion. Anterior drawer test negative. Normal pulse.     Left Achilles Tendon: Normal. No tenderness or defects. Thompson's  test negative.  Skin:    General: Skin is warm and dry.     Capillary Refill: Capillary refill takes less than 2 seconds.  Neurological:     General: No focal deficit present.     Mental Status: She is alert. Mental status is at baseline.     Gait: Gait abnormal (antalgic).  Psychiatric:        Mood and Affect: Mood normal.        Behavior: Behavior normal.        Thought Content: Thought content normal.        Judgment: Judgment normal.       No results found for any visits on 09/06/20.  Assessment & Plan     1. Injury of left ankle, initial encounter Twisted ankle on Saturday and now having issues weight bearing. Most tender over the base of the 5th metatarsal. Xrays ordered. Will f/u pending results.  - DG Ankle Complete Left; Future - DG Foot Complete Left; Future - Ambulatory referral to Orthopedic Surgery - ibuprofen (ADVIL) 600 MG tablet; Take 1 tablet (600 mg total) by mouth every 8 (eight) hours as needed.  Dispense: 30 tablet; Refill: 0  2. Cystitis with hematuria UA positive. Will treat with Augmentin as below. Urine sent for culture.  - POCT urinalysis dipstick - CULTURE, URINE COMPREHENSIVE - Urinalysis, microscopic only - amoxicillin-clavulanate (AUGMENTIN) 875-125 MG tablet; Take 1 tablet by mouth 2 (two) times daily.  Dispense: 10 tablet; Refill: 0  3. Generalized anxiety disorder Stable. Diagnosis pulled for medication refill. Continue current medical treatment plan. - FLUoxetine (PROZAC) 40 MG capsule; Take 1 capsule (40 mg total) by mouth daily.  Dispense: 90 capsule; Refill: 3  4. Closed non-physeal fracture of fifth metatarsal bone of left foot, initial encounter Xray has not resulted but image reviewed by provider with obvious fracture in base of 5th metatarsal. Referral urgently placed to orthopedics. - Ambulatory referral to Orthopedic Surgery - ibuprofen (ADVIL) 600 MG tablet; Take 1 tablet (600 mg total) by mouth every 8 (eight) hours as needed.   Dispense: 30 tablet; Refill: 0   No follow-ups on file.      Delmer Islam, PA-C, have reviewed all documentation for this visit. The documentation on 09/06/20 for the exam, diagnosis, procedures, and orders are all accurate and complete.   Reine Just  [facility] [phone] (phone) [phone] (fax)  [facility]

## 2020-09-06 NOTE — Telephone Encounter (Signed)
Pt. Reports she "twisted" her left ankle Saturday. Ankle is swollen, bruised. Hurts to ambulate. Appointment made for today.  Reason for Disposition . [1] Very swollen joint AND [2] no fever  Answer Assessment - Initial Assessment Questions 1. ONSET: "When did the pain start?"      Saturday 2. LOCATION: "Where is the pain located?"      Left ankle 3. PAIN: "How bad is the pain?"    (Scale 1-10; or mild, moderate, severe)  - MILD (1-3): doesn't interfere with normal activities.   - MODERATE (4-7): interferes with normal activities (e.g., work or school) or awakens from sleep, limping.   - SEVERE (8-10): excruciating pain, unable to do any normal activities, unable to walk.      9 4. WORK OR EXERCISE: "Has there been any recent work or exercise that involved this part of the body?"      No 5. CAUSE: "What do you think is causing the ankle pain?"     Twisted 6. OTHER SYMPTOMS: "Do you have any other symptoms?" (e.g., calf pain, rash, fever, swelling)     No 7. PREGNANCY: "Is there any chance you are pregnant?" "When was your last menstrual period?"     No  Protocols used: ANKLE PAIN-A-AH

## 2020-09-07 DIAGNOSIS — S93492A Sprain of other ligament of left ankle, initial encounter: Secondary | ICD-10-CM | POA: Diagnosis not present

## 2020-09-07 DIAGNOSIS — S92352A Displaced fracture of fifth metatarsal bone, left foot, initial encounter for closed fracture: Secondary | ICD-10-CM | POA: Diagnosis not present

## 2020-09-09 LAB — CULTURE, URINE COMPREHENSIVE

## 2020-09-09 LAB — URINALYSIS, MICROSCOPIC ONLY
Casts: NONE SEEN /lpf
WBC, UA: >30 /hpf — AB (ref 0–5)

## 2020-09-17 DIAGNOSIS — S92352A Displaced fracture of fifth metatarsal bone, left foot, initial encounter for closed fracture: Secondary | ICD-10-CM | POA: Diagnosis not present

## 2020-09-17 DIAGNOSIS — S93492A Sprain of other ligament of left ankle, initial encounter: Secondary | ICD-10-CM | POA: Diagnosis not present

## 2020-09-20 ENCOUNTER — Telehealth: Payer: Self-pay | Admitting: Physician Assistant

## 2020-09-20 DIAGNOSIS — N3091 Cystitis, unspecified with hematuria: Secondary | ICD-10-CM

## 2020-09-22 MED ORDER — AMOXICILLIN-POT CLAVULANATE 875-125 MG PO TABS: 1.0000 | ORAL_TABLET | Freq: Two times a day (BID) | ORAL | 0 refills | Status: DC

## 2020-09-22 NOTE — Telephone Encounter (Signed)
Sent in refill

## 2020-09-22 NOTE — Addendum Note (Signed)
Addended by: Margaretann Loveless on: 09/22/2020 03:25 PM   Modules accepted: Orders

## 2020-09-22 NOTE — Telephone Encounter (Signed)
Pt calling and is requesting to speak with PCP. She states that she is needing to have the amoxicillin refilled due to her still having symptoms. Please advise.     [facility] [address], [place] - [address]  [address] [place] [address]  Phone: [phone] Fax: [phone]  Hours: Not open 24 hours

## 2020-09-23 ENCOUNTER — Ambulatory Visit: Payer: Self-pay | Admitting: Physician Assistant

## 2020-10-06 ENCOUNTER — Telehealth: Payer: Self-pay

## 2020-10-06 NOTE — Telephone Encounter (Signed)
Copied from CRM [id number]. Topic: Appointment Scheduling - Scheduling Inquiry for Clinic >> Oct 06, 2020 12:19 PM Marie Foley A wrote: Patient would like to schedule appt for covid booster on Friday 1/7

## 2020-10-08 DIAGNOSIS — S92352A Displaced fracture of fifth metatarsal bone, left foot, initial encounter for closed fracture: Secondary | ICD-10-CM | POA: Diagnosis not present

## 2020-10-12 ENCOUNTER — Ambulatory Visit
Admission: RE | Admit: 2020-10-12 | Discharge: 2020-10-12 | Disposition: A | Discharge status: Home / Self Care | Payer: BC Managed Care – PPO | Source: Ambulatory Visit | Attending: Physician Assistant | Admitting: Physician Assistant

## 2020-10-12 ENCOUNTER — Other Ambulatory Visit: Payer: Self-pay

## 2020-10-12 DIAGNOSIS — Z1231 Encounter for screening mammogram for malignant neoplasm of breast: Secondary | ICD-10-CM | POA: Insufficient documentation

## 2020-10-12 DIAGNOSIS — Z1239 Encounter for other screening for malignant neoplasm of breast: Secondary | ICD-10-CM

## 2020-10-12 DIAGNOSIS — R590 Localized enlarged lymph nodes: Secondary | ICD-10-CM | POA: Diagnosis not present

## 2020-10-13 ENCOUNTER — Telehealth: Payer: Self-pay | Admitting: Physician Assistant

## 2020-10-13 ENCOUNTER — Other Ambulatory Visit: Payer: Self-pay | Admitting: Physician Assistant

## 2020-10-13 DIAGNOSIS — N3091 Cystitis, unspecified with hematuria: Secondary | ICD-10-CM

## 2020-10-13 DIAGNOSIS — R928 Other abnormal and inconclusive findings on diagnostic imaging of breast: Secondary | ICD-10-CM

## 2020-10-13 DIAGNOSIS — N6489 Other specified disorders of breast: Secondary | ICD-10-CM

## 2020-10-13 NOTE — Telephone Encounter (Signed)
Patient would like to be contacted by PCP to discuss mammogram results as well as receive advice. Patient deleted information in the original email sent to her by the breast center.

## 2020-10-13 NOTE — Telephone Encounter (Signed)
Wants to talk to PCP only.

## 2020-10-20 MED ORDER — AMOXICILLIN-POT CLAVULANATE 875-125 MG PO TABS: 1.0000 | ORAL_TABLET | Freq: Two times a day (BID) | ORAL | 0 refills | Status: DC

## 2020-10-20 NOTE — Telephone Encounter (Signed)
Called patient and left VM

## 2020-10-20 NOTE — Addendum Note (Signed)
Addended by: Margaretann Loveless on: 10/20/2020 06:45 PM   Modules accepted: Orders

## 2020-10-20 NOTE — Telephone Encounter (Signed)
Called patient back and answered all questions about mammogram and what is to come.  She does mention that she has noticed the left breast is larger in the area of concern. She had not noticed prior to this report, but reports she was not checking either. She had felt her bra was tighter just due to fat.   She does also mention continued UTI symptoms. They had improved slightly but have returned. Will send in Augmentin.

## 2020-10-28 ENCOUNTER — Ambulatory Visit
Admission: RE | Admit: 2020-10-28 | Discharge: 2020-10-28 | Disposition: A | Discharge status: Home / Self Care | Payer: BC Managed Care – PPO | Source: Ambulatory Visit | Attending: Physician Assistant | Admitting: Physician Assistant

## 2020-10-28 ENCOUNTER — Other Ambulatory Visit: Payer: Self-pay

## 2020-10-28 DIAGNOSIS — N6489 Other specified disorders of breast: Secondary | ICD-10-CM | POA: Insufficient documentation

## 2020-10-28 DIAGNOSIS — R928 Other abnormal and inconclusive findings on diagnostic imaging of breast: Secondary | ICD-10-CM | POA: Diagnosis not present

## 2020-11-04 DIAGNOSIS — S92352A Displaced fracture of fifth metatarsal bone, left foot, initial encounter for closed fracture: Secondary | ICD-10-CM | POA: Diagnosis not present

## 2020-11-12 ENCOUNTER — Other Ambulatory Visit: Payer: Self-pay | Admitting: Physician Assistant

## 2020-11-12 ENCOUNTER — Telehealth: Payer: Self-pay | Admitting: Physician Assistant

## 2020-11-12 DIAGNOSIS — F411 Generalized anxiety disorder: Secondary | ICD-10-CM

## 2020-11-12 NOTE — Telephone Encounter (Signed)
Requested medication (s) are due for refill today: new request  Requested medication (s) are on the active medication list: yes  Last refill:  04/07/20 #90 5 refills  Future visit scheduled: no  Notes to clinic:  not delegated per protocol     Requested Prescriptions  Pending Prescriptions Disp Refills   ALPRAZolam (XANAX) 0.5 MG tablet [Pharmacy Med Name: ALPRAZOLAM 0.5 MG TABLET] 90 tablet     Sig: TAKE 1 TABLET (0.5 MG TOTAL) BY MOUTH EVERY 8 (EIGHT) HOURS AS NEEDED. FOR ANXIETY      Not Delegated - Psychiatry:  Anxiolytics/Hypnotics Failed - 11/12/2020  6:43 PM      Failed - This refill cannot be delegated      Failed - Urine Drug Screen completed in last 360 days      Passed - Valid encounter within last 6 months    Recent Outpatient Visits           2 months ago Injury of left ankle, initial encounter   [facility] [place], Alessandra Bevels, [place]   1 year ago Sciatica of right side   [facility] [place], Alessandra Bevel[place], [place]   1 year ago Nasal congestion   [facility] [place], [place], [place]   1 year ago Cystitis with hematuria   [facility] [place], Marzella Schlein, [place]   1 year ago Acute cystitis without hematuria   [facility], Alessandra Bevel[place], [place]

## 2020-11-12 NOTE — Telephone Encounter (Signed)
Medication: ALPRAZolam (XANAX) 0.5 MG tablet [[id number]]   Has the patient contacted their pharmacy? YES (Agent: If no, request that the patient contact the pharmacy for the refill.) (Agent: If yes, when and what did the pharmacy advise?)  Preferred Pharmacy (with phone number or street name): [facility] [address] [place] - [address] [place] [address] Phone: [phone] Fax: [phone] Hours: Not open 24 hours    Agent: Please be advised that RX refills may take up to 3 business days. We ask that you follow-up with your pharmacy.

## 2020-11-30 ENCOUNTER — Other Ambulatory Visit: Payer: Self-pay

## 2020-11-30 ENCOUNTER — Ambulatory Visit: Payer: BC Managed Care – PPO | Admitting: Physician Assistant

## 2020-11-30 ENCOUNTER — Encounter: Payer: Self-pay | Admitting: Physician Assistant

## 2020-11-30 VITALS — BP 130/84 | HR 111 | Temp 98.1°F

## 2020-11-30 DIAGNOSIS — M25562 Pain in left knee: Secondary | ICD-10-CM

## 2020-11-30 DIAGNOSIS — M25561 Pain in right knee: Secondary | ICD-10-CM | POA: Diagnosis not present

## 2020-11-30 DIAGNOSIS — G8929 Other chronic pain: Secondary | ICD-10-CM

## 2020-11-30 MED ORDER — METHYLPREDNISOLONE 4 MG PO TBPK
ORAL_TABLET | ORAL | 0 refills | Status: DC
Start: 2020-11-30 — End: 2021-05-25

## 2020-11-30 NOTE — Progress Notes (Signed)
Acute Office Visit  Subjective:    Patient ID: Marie Foley, female    DOB: [birth year], 65 y.o.   MRN: [id number]  No chief complaint on file.   HPI Patient is in today for bilateral knee pain.  She has had pain for about 1 year, with the last couple of months getting progressively worse.  Standing, walking and especially climbing stairs cause more pain.  She has been using ice and Tylenol but not getting a lot of relief. `She has never had any trauma to either knee, nor has she ever had either knee x-rayed.  No past medical history on file.  Past Surgical History:  Procedure Laterality Date  . ABDOMINAL HYSTERECTOMY  10/07/2002   Dr. Luella Cook; due to hemorrhaging  . BREAST EXCISIONAL BIOPSY Left 1980's   EXCISIONAL BX - NEG    Family History  Problem Relation Age of Onset  . Early death Mother   . Alzheimer's disease Father   . Depression Father   . Depression Sister   . Depression Sister   . Depression Sister   . Breast cancer Paternal Grandmother     Social History   Socioeconomic History  . Marital status: Widowed    Spouse name: Not on file  . Number of children: Not on file  . Years of education: Not on file  . Highest education level: Not on file  Occupational History  . Not on file  Tobacco Use  . Smoking status: Former Smoker    Quit date: 10/01/1998    Years since quitting: 22.1  . Smokeless tobacco: Never Used  Substance and Sexual Activity  . Alcohol use: Yes    Comment: ocassional  . Drug use: No  . Sexual activity: Not on file  Other Topics Concern  . Not on file  Social History Narrative  . Not on file   Social Determinants of Health   Financial Resource Strain: Not on file  Food Insecurity: Not on file  Transportation Needs: Not on file  Physical Activity: Not on file  Stress: Not on file  Social Connections: Not on file  Intimate Partner Violence: Not on file    Outpatient Medications Prior to Visit  Medication Sig Dispense  Refill  . ALPRAZolam (XANAX) 0.5 MG tablet TAKE 1 TABLET (0.5 MG TOTAL) BY MOUTH EVERY 8 (EIGHT) HOURS AS NEEDED. FOR ANXIETY 90 tablet 1  . amoxicillin-clavulanate (AUGMENTIN) 875-125 MG tablet Take 1 tablet by mouth 2 (two) times daily. 20 tablet 0  . conjugated estrogens (PREMARIN) vaginal cream Place 1 Applicatorful vaginally daily. May decrease to three times per week, then once weekly as symptoms improve 42.5 g 12  . cyclobenzaprine (FLEXERIL) 5 MG tablet Take 1 tablet (5 mg total) by mouth 3 (three) times daily as needed for muscle spasms. 30 tablet 0  . FLUoxetine (PROZAC) 40 MG capsule Take 1 capsule (40 mg total) by mouth daily. 90 capsule 3  . fluticasone (FLONASE) 50 MCG/ACT nasal spray PLACE 1 SPRAY INTO BOTH NOSTRILS 2 (TWO) TIMES DAILY. 48 mL 1  . Fluticasone-Salmeterol (ADVAIR DISKUS) 250-50 MCG/DOSE AEPB Inhale 1 puff into the lungs as needed.     Marland Kitchen ibuprofen (ADVIL) 600 MG tablet Take 1 tablet (600 mg total) by mouth every 8 (eight) hours as needed. 30 tablet 0   No facility-administered medications prior to visit.    Allergies  Allergen Reactions  . Bactrim [Sulfamethoxazole-Trimethoprim] Nausea And Vomiting  . Diclofenac-Misoprostol   . Macrobid [Nitrofurantoin Macrocrystal] Nausea  And Vomiting    Review of Systems  Constitutional: Negative.   Respiratory: Negative.   Cardiovascular: Negative.   Musculoskeletal: Positive for arthralgias, back pain and gait problem.  Neurological: Negative for weakness and numbness.       Objective:    Physical Exam Vitals reviewed.  Constitutional:      General: She is not in acute distress.    Appearance: Normal appearance. She is well-developed and well-nourished. She is not ill-appearing.  HENT:     Head: Normocephalic and atraumatic.  Eyes:     Extraocular Movements: EOM normal.  Pulmonary:     Effort: Pulmonary effort is normal. No respiratory distress.  Musculoskeletal:     Cervical back: Normal range of motion and  neck supple.  Neurological:     General: No focal deficit present.     Mental Status: She is alert. Mental status is at baseline.  Psychiatric:        Mood and Affect: Mood and affect normal.        Behavior: Behavior normal.        Thought Content: Thought content normal.        Judgment: Judgment normal.     BP 130/84 (BP Location: Left Arm, Patient Position: Sitting, Cuff Size: Large)   Pulse (!) 111   Temp 98.1 F (36.7 C) (Oral)   SpO2 94%  Wt Readings from Last 3 Encounters:  07/10/18 230 lb (104.3 kg)  07/02/17 231 lb (104.8 kg)  11/22/16 230 lb 9.6 oz (104.6 kg)    Health Maintenance Due  Topic Date Due  . INFLUENZA VACCINE  05/02/2020  . COVID-19 Vaccine (3 - Booster for Moderna series) 07/24/2020    There are no preventive care reminders to display for this patient.   Lab Results  Component Value Date   TSH 1.840 07/10/2018   Lab Results  Component Value Date   WBC 10.3 07/10/2018   HGB 12.9 07/10/2018   HCT 39.1 07/10/2018   MCV 91 07/10/2018   PLT 448 07/10/2018   Lab Results  Component Value Date   NA 139 07/10/2018   K 4.5 07/10/2018   CO2 24 07/10/2018   GLUCOSE 88 07/10/2018   BUN 12 07/10/2018   CREATININE 0.72 07/10/2018   BILITOT 0.5 07/10/2018   ALKPHOS 105 07/10/2018   AST 17 07/10/2018   ALT 17 07/10/2018   PROT 6.9 07/10/2018   ALBUMIN 4.6 07/10/2018   CALCIUM 10.1 07/10/2018   ANIONGAP 8 09/12/2014   Lab Results  Component Value Date   CHOL 216 (H) 07/10/2018   Lab Results  Component Value Date   HDL 61 07/10/2018   Lab Results  Component Value Date   LDLCALC 138 (H) 07/10/2018   Lab Results  Component Value Date   TRIG 85 07/10/2018   Lab Results  Component Value Date   CHOLHDL 3.5 07/10/2018   Lab Results  Component Value Date   HGBA1C 6.1 (H) 07/10/2018       Assessment & Plan:   1. Chronic pain of both knees Will give medrol as below. Discussed imaging but patient desires to wait at this time due to  other medical bills at this time. Discussed wearing knee braces at work but patient states she would not be able to. Also advised to use Voltaren gel topically four times per day. Unable to take NSAIDs due to h/o esophageal ulcers. Will follow up prn for pain. - methylPREDNISolone (MEDROL) 4 MG TBPK tablet; 6 day  taper; take as directed on package instructions  Dispense: 21 tablet; Refill: 0   No orders of the defined types were placed in this encounter.    Adline Peals, CMA

## 2020-11-30 NOTE — Patient Instructions (Signed)
Voltaren (diclofenac) gel    Journal for Nurse Practitioners, 15(4), [id number]. Retrieved July 08, 2018 from http://clinicalkey.com/nursing">  Knee Exercises Ask your health care provider which exercises are safe for you. Do exercises exactly as told by your health care provider and adjust them as directed. It is normal to feel mild stretching, pulling, tightness, or discomfort as you do these exercises. Stop right away if you feel sudden pain or your pain gets worse. Do not begin these exercises until told by your health care provider. Stretching and range-of-motion exercises These exercises warm up your muscles and joints and improve the movement and flexibility of your knee. These exercises also help to relieve pain and swelling. Knee extension, prone 1. Lie on your abdomen (prone position) on a bed. 2. Place your left / right knee just beyond the edge of the surface so your knee is not on the bed. You can put a towel under your left / right thigh just above your kneecap for comfort. 3. Relax your leg muscles and allow gravity to straighten your knee (extension). You should feel a stretch behind your left / right knee. 4. Hold this position for __________ seconds. 5. Scoot up so your knee is supported between repetitions. Repeat __________ times. Complete this exercise __________ times a day. Knee flexion, active 1. Lie on your back with both legs straight. If this causes back discomfort, bend your left / right knee so your foot is flat on the floor. 2. Slowly slide your left / right heel back toward your buttocks. Stop when you feel a gentle stretch in the front of your knee or thigh (flexion). 3. Hold this position for __________ seconds. 4. Slowly slide your left / right heel back to the starting position. Repeat __________ times. Complete this exercise __________ times a day.   Quadriceps stretch, prone 1. Lie on your abdomen on a firm surface, such as a bed or padded floor. 2. Bend  your left / right knee and hold your ankle. If you cannot reach your ankle or pant leg, loop a belt around your foot and grab the belt instead. 3. Gently pull your heel toward your buttocks. Your knee should not slide out to the side. You should feel a stretch in the front of your thigh and knee (quadriceps). 4. Hold this position for __________ seconds. Repeat __________ times. Complete this exercise __________ times a day.   Hamstring, supine 1. Lie on your back (supine position). 2. Loop a belt or towel over the ball of your left / right foot. The ball of your foot is on the walking surface, right under your toes. 3. Straighten your left / right knee and slowly pull on the belt to raise your leg until you feel a gentle stretch behind your knee (hamstring). ? Do not let your knee bend while you do this. ? Keep your other leg flat on the floor. 4. Hold this position for __________ seconds. Repeat __________ times. Complete this exercise __________ times a day. Strengthening exercises These exercises build strength and endurance in your knee. Endurance is the ability to use your muscles for a long time, even after they get tired. Quadriceps, isometric This exercise stretches the muscles in front of your thigh (quadriceps) without moving your knee joint (isometric). 1. Lie on your back with your left / right leg extended and your other knee bent. Put a rolled towel or small pillow under your knee if told by your health care provider. 2. Slowly tense the  muscles in the front of your left / right thigh. You should see your kneecap slide up toward your hip or see increased dimpling just above the knee. This motion will push the back of the knee toward the floor. 3. For __________ seconds, hold the muscle as tight as you can without increasing your pain. 4. Relax the muscles slowly and completely. Repeat __________ times. Complete this exercise __________ times a day.   Straight leg raises This  exercise stretches the muscles in front of your thigh (quadriceps) and the muscles that move your hips (hip flexors). 1. Lie on your back with your left / right leg extended and your other knee bent. 2. Tense the muscles in the front of your left / right thigh. You should see your kneecap slide up or see increased dimpling just above the knee. Your thigh may even shake a bit. 3. Keep these muscles tight as you raise your leg 4-6 inches (10-15 cm) off the floor. Do not let your knee bend. 4. Hold this position for __________ seconds. 5. Keep these muscles tense as you lower your leg. 6. Relax your muscles slowly and completely after each repetition. Repeat __________ times. Complete this exercise __________ times a day. Hamstring, isometric 1. Lie on your back on a firm surface. 2. Bend your left / right knee about __________ degrees. 3. Dig your left / right heel into the surface as if you are trying to pull it toward your buttocks. Tighten the muscles in the back of your thighs (hamstring) to "dig" as hard as you can without increasing any pain. 4. Hold this position for __________ seconds. 5. Release the tension gradually and allow your muscles to relax completely for __________ seconds after each repetition. Repeat __________ times. Complete this exercise __________ times a day. Hamstring curls If told by your health care provider, do this exercise while wearing ankle weights. Begin with __________ lb weights. Then increase the weight by 1 lb (0.5 kg) increments. Do not wear ankle weights that are more than __________ lb. 1. Lie on your abdomen with your legs straight. 2. Bend your left / right knee as far as you can without feeling pain. Keep your hips flat against the floor. 3. Hold this position for __________ seconds. 4. Slowly lower your leg to the starting position. Repeat __________ times. Complete this exercise __________ times a day.   Squats This exercise strengthens the muscles  in front of your thigh and knee (quadriceps). 1. Stand in front of a table, with your feet and knees pointing straight ahead. You may rest your hands on the table for balance but not for support. 2. Slowly bend your knees and lower your hips like you are going to sit in a chair. ? Keep your weight over your heels, not over your toes. ? Keep your lower legs upright so they are parallel with the table legs. ? Do not let your hips go lower than your knees. ? Do not bend lower than told by your health care provider. ? If your knee pain increases, do not bend as low. 3. Hold the squat position for __________ seconds. 4. Slowly push with your legs to return to standing. Do not use your hands to pull yourself to standing. Repeat __________ times. Complete this exercise __________ times a day. Wall slides This exercise strengthens the muscles in front of your thigh and knee (quadriceps). 1. Lean your back against a smooth wall or door, and walk your feet out 18-24  inches (46-61 cm) from it. 2. Place your feet hip-width apart. 3. Slowly slide down the wall or door until your knees bend __________ degrees. Keep your knees over your heels, not over your toes. Keep your knees in line with your hips. 4. Hold this position for __________ seconds. Repeat __________ times. Complete this exercise __________ times a day.   Straight leg raises This exercise strengthens the muscles that rotate the leg at the hip and move it away from your body (hip abductors). 1. Lie on your side with your left / right leg in the top position. Lie so your head, shoulder, knee, and hip line up. You may bend your bottom knee to help you keep your balance. 2. Roll your hips slightly forward so your hips are stacked directly over each other and your left / right knee is facing forward. 3. Leading with your heel, lift your top leg 4-6 inches (10-15 cm). You should feel the muscles in your outer hip lifting. ? Do not let your foot  drift forward. ? Do not let your knee roll toward the ceiling. 4. Hold this position for __________ seconds. 5. Slowly return your leg to the starting position. 6. Let your muscles relax completely after each repetition. Repeat __________ times. Complete this exercise __________ times a day.   Straight leg raises This exercise stretches the muscles that move your hips away from the front of the pelvis (hip extensors). 1. Lie on your abdomen on a firm surface. You can put a pillow under your hips if that is more comfortable. 2. Tense the muscles in your buttocks and lift your left / right leg about 4-6 inches (10-15 cm). Keep your knee straight as you lift your leg. 3. Hold this position for __________ seconds. 4. Slowly lower your leg to the starting position. 5. Let your leg relax completely after each repetition. Repeat __________ times. Complete this exercise __________ times a day. This information is not intended to replace advice given to you by your health care provider. Make sure you discuss any questions you have with your health care provider. Document Revised: 07/09/2018 Document Reviewed: 07/09/2018 Elsevier Patient Education  2021 [employer].

## 2020-12-27 ENCOUNTER — Telehealth: Payer: Self-pay

## 2020-12-27 NOTE — Telephone Encounter (Signed)
Copied from CRM [id number]. Topic: General - Other >> Dec 24, 2020  2:40 PM Pawlus, Maxine Glenn A wrote: Reason for CRM: Pt wanted to talk to [facility] regarding getting Steroids sent in for her knees. Please advise.

## 2020-12-28 NOTE — Telephone Encounter (Signed)
She will need an appt.

## 2020-12-29 NOTE — Telephone Encounter (Signed)
Patient was advised and declined appointment. She states she will call back if knee gets any worse.

## 2021-02-07 ENCOUNTER — Other Ambulatory Visit: Payer: Self-pay | Admitting: Family Medicine

## 2021-02-07 DIAGNOSIS — F411 Generalized anxiety disorder: Secondary | ICD-10-CM

## 2021-02-07 MED ORDER — ALPRAZOLAM 0.5 MG PO TABS: 0.5000 mg | ORAL_TABLET | Freq: Three times a day (TID) | ORAL | 1 refills | Status: DC | PRN

## 2021-02-07 NOTE — Telephone Encounter (Signed)
Requested medication (s) are due for refill today:   Provider to review   Requested medication (s) are on the active medication list:   Yes  Future visit scheduled:   No  [place] pt.   Last ordered: 11/15/2020 #90, 1 refill  Non delegated refill    Requested Prescriptions  Pending Prescriptions Disp Refills   ALPRAZolam (XANAX) 0.5 MG tablet 90 tablet 1    Sig: Take 1 tablet (0.5 mg total) by mouth every 8 (eight) hours as needed. for anxiety      Not Delegated - Psychiatry:  Anxiolytics/Hypnotics Failed - 02/07/2021 12:26 PM      Failed - This refill cannot be delegated      Failed - Urine Drug Screen completed in last 360 days      Passed - Valid encounter within last 6 months    Recent Outpatient Visits           2 months ago Chronic pain of both knees   [facility] [place], [place], [place]   5 months ago Injury of left ankle, initial encounter   [facility] [place], [place], [place]   1 year ago Sciatica of right side   [facility] [place], [place], [place]   1 year ago Nasal congestion   [facility] [place], [place], [place]   1 year ago Cystitis with hematuria   [facility], Marzella Schlein, [place]

## 2021-02-07 NOTE — Telephone Encounter (Signed)
Medication Refill - Medication: ALPRAZolam (XANAX) 0.5 MG tablet    Has the patient contacted their pharmacy? No. (Agent: If no, request that the patient contact the pharmacy for the refill.) (Agent: If yes, when and what did the pharmacy advise?)  Preferred Pharmacy (with phone number or street name):  [facility] [address] [place] - [address]  [address] [place] [address]  Phone: [phone] Fax: [phone]     Agent: Please be advised that RX refills may take up to 3 business days. We ask that you follow-up with your pharmacy.

## 2021-02-14 ENCOUNTER — Ambulatory Visit: Payer: BC Managed Care – PPO | Admitting: Dermatology

## 2021-02-14 ENCOUNTER — Telehealth: Payer: Self-pay

## 2021-02-14 NOTE — Telephone Encounter (Signed)
Spoke with patient on the phone and advised her that she would need to schedule office visit for evaluation . Patient states that she is unable to take off work at this time for appointment but would like antibiotic called in. I offered patient a virtual visit with Dr. Sullivan Lone tomorrow morning since there was opening, she states that she cannot do video because she will be at the job and requested telephone visit, I advised patient that a urine specimen would still need to be brought to office for testing and she states that she will drop it off shortly before he lunch break tomorrow. KW

## 2021-02-14 NOTE — Telephone Encounter (Signed)
FYI

## 2021-02-14 NOTE — Telephone Encounter (Signed)
Copied from CRM [id number]. Topic: General - Other >> Feb 14, 2021  8:56 AM Jaquita Rector A wrote: Reason for CRM: Patient called in to inform Dr B that she is having a UTI with painful urination, very little output. Say that she have had many and need an Rx sent to her pharmacy please asking Dr B to please look at her past record and send medication accordingly. Can be reached at  Ph# [phone]

## 2021-02-15 ENCOUNTER — Ambulatory Visit (INDEPENDENT_AMBULATORY_CARE_PROVIDER_SITE_OTHER): Payer: BC Managed Care – PPO | Admitting: Family Medicine

## 2021-02-15 ENCOUNTER — Telehealth: Payer: Self-pay | Admitting: Family Medicine

## 2021-02-15 ENCOUNTER — Other Ambulatory Visit: Payer: Self-pay | Admitting: Family Medicine

## 2021-02-15 DIAGNOSIS — N3000 Acute cystitis without hematuria: Secondary | ICD-10-CM | POA: Diagnosis not present

## 2021-02-15 MED ORDER — CIPROFLOXACIN HCL 250 MG PO TABS: 250.0000 mg | ORAL_TABLET | Freq: Two times a day (BID) | ORAL | 0 refills | Status: AC

## 2021-02-15 NOTE — Telephone Encounter (Signed)
Requested medication (s) are due for refill today: Yes  Requested medication (s) are on the active medication list: No  Last refill:  02/15/21  Future visit scheduled: No  Notes to clinic: Original 250 mg tab out of stock, pharmacy requesting 500 mg, see highlighted pharmacy notes.     Requested Prescriptions  Pending Prescriptions Disp Refills   ciprofloxacin (CIPRO) 250 MG tablet [Pharmacy Med Name: CIPROFLOXACIN HCL 250 MG TAB] 10 tablet 0    Sig: TAKE 1 TABLET BY MOUTH TWICE A DAY FOR 3 DAYS      Off-Protocol Failed - 02/15/2021  4:46 PM      Failed - Medication not assigned to a protocol, review manually.      Passed - Valid encounter within last 12 months    Recent Outpatient Visits           Today Acute cystitis without hematuria   [facility] [place]., [place]   2 months ago Chronic pain of both knees   [facility], [place], [place]   5 months ago Injury of left ankle, initial encounter   [facility] [place], [place], [place]   1 year ago Sciatica of right side   [facility] [place], [place], [place]   1 year ago Nasal congestion   [facility] Joycelyn Man [place], [place]

## 2021-02-15 NOTE — Telephone Encounter (Signed)
Pt called saying the pharmacy told her that he Cipro 250 was on back order.  She could call Dr. Sullivan Lone and see if the 500 mg could be sent in and she could cut it in 1/2.   The pharmacy told her they sent a message to the office regarding this.

## 2021-02-15 NOTE — Progress Notes (Signed)
Virtual telephone visit    Virtual Visit via Telephone Note   This visit type was conducted due to national recommendations for restrictions regarding the COVID-19 Pandemic (e.g. social distancing) in an effort to limit this patient's exposure and mitigate transmission in our community. Due to her co-morbid illnesses, this patient is at least at moderate risk for complications without adequate follow up. This format is felt to be most appropriate for this patient at this time. The patient did not have access to video technology or had technical difficulties with video requiring transitioning to audio format only (telephone). Physical exam was limited to content and character of the telephone converstion.    Patient location: Home Provider location: Office  I discussed the limitations of evaluation and management by telemedicine and the availability of in person appointments. The patient expressed understanding and agreed to proceed.   Visit Date: 02/15/2021  Today's healthcare provider: Megan Mans, MD   No chief complaint on file.  Subjective    Urinary Tract Infection  This is a new problem. The current episode started in the past 7 days (about 4 days). The pain is mild. There has been no fever. Associated symptoms include frequency and urgency. Pertinent negatives include no hematuria or nausea. She has tried acetaminophen for the symptoms.  Telephone visit.  Unsure of reason.  Patient has had chills without hematuria or fever.  No respiratory symptoms.  She has urinary frequency and dysuria especially terminal dysuria.       Medications: Outpatient Medications Prior to Visit  Medication Sig  . ALPRAZolam (XANAX) 0.5 MG tablet Take 1 tablet (0.5 mg total) by mouth every 8 (eight) hours as needed. for anxiety  . conjugated estrogens (PREMARIN) vaginal cream Place 1 Applicatorful vaginally daily. May decrease to three times per week, then once weekly as symptoms improve   . cyclobenzaprine (FLEXERIL) 5 MG tablet Take 1 tablet (5 mg total) by mouth 3 (three) times daily as needed for muscle spasms.  Marland Kitchen FLUoxetine (PROZAC) 40 MG capsule Take 1 capsule (40 mg total) by mouth daily.  . fluticasone (FLONASE) 50 MCG/ACT nasal spray PLACE 1 SPRAY INTO BOTH NOSTRILS 2 (TWO) TIMES DAILY.  Marland Kitchen Fluticasone-Salmeterol (ADVAIR DISKUS) 250-50 MCG/DOSE AEPB Inhale 1 puff into the lungs as needed.   Marland Kitchen ibuprofen (ADVIL) 600 MG tablet Take 1 tablet (600 mg total) by mouth every 8 (eight) hours as needed.  . methylPREDNISolone (MEDROL) 4 MG TBPK tablet 6 day taper; take as directed on package instructions   No facility-administered medications prior to visit.    Review of Systems  Gastrointestinal: Negative for nausea.  Genitourinary: Positive for frequency and urgency. Negative for hematuria.       Objective    There were no vitals taken for this visit.      Assessment & Plan     1. Acute cystitis without hematuria Push fluids and try cranberry juice daily in addition to Cipro.  She is taking this without difficulty in the past.  May need urogynecology referral.  UTIs not a frequent problem for her.   No follow-ups on file.    I discussed the assessment and treatment plan with the patient. The patient was provided an opportunity to ask questions and all were answered. The patient agreed with the plan and demonstrated an understanding of the instructions.   The patient was advised to call back or seek an in-person evaluation if the symptoms worsen or if the condition fails to improve as  anticipated.  I provided 11 minutes of non-face-to-face time during this encounter.  I, Megan Mans, MD, have reviewed all documentation for this visit. The documentation on 02/15/21 for the exam, diagnosis, procedures, and orders are all accurate and complete.   Richard Wendelyn Breslow, MD [facility] [phone] (phone) [phone] (fax)  [facility]

## 2021-02-15 NOTE — Telephone Encounter (Signed)
No urine drop off

## 2021-02-15 NOTE — Telephone Encounter (Signed)
  Notes to clinic:  Product Backordered/Unavailable  Requested Prescriptions  Pending Prescriptions Disp Refills   ciprofloxacin (CIPRO) 500 MG tablet [Pharmacy Med Name: CIPROFLOXACIN HCL 500 MG TAB]  0    Sig: Please specify directions, refills and quantity      Off-Protocol Failed - 02/15/2021  8:42 AM      Failed - Medication not assigned to a protocol, review manually.      Passed - Valid encounter within last 12 months    Recent Outpatient Visits           Today Acute cystitis without hematuria   [facility] Maple Hudson., MD   2 months ago Chronic pain of both knees   [facility] [place], [place], [place]   5 months ago Injury of left ankle, initial encounter   [facility] [place], [place], [place]   1 year ago Sciatica of right side   [facility] [place], [place], [place]   1 year ago Nasal congestion   [facility] Joycelyn Man [place], [place]

## 2021-02-16 MED ORDER — DOXYCYCLINE HYCLATE 100 MG PO TABS: 100.0000 mg | ORAL_TABLET | Freq: Two times a day (BID) | ORAL | 0 refills | Status: AC

## 2021-02-16 NOTE — Telephone Encounter (Signed)
Patient is calling again to check the status of her request for another antibiotic.  Please call patient to let her know if doctor has sent another script to pharmacy.

## 2021-02-16 NOTE — Telephone Encounter (Signed)
Patient was advised of this 

## 2021-02-16 NOTE — Telephone Encounter (Signed)
Please advise 

## 2021-02-16 NOTE — Telephone Encounter (Signed)
Doxy 100mg  BID for 3 days.

## 2021-02-16 NOTE — Telephone Encounter (Signed)
Medication sent into the pharmacy. 

## 2021-02-16 NOTE — Addendum Note (Signed)
Addended by: Anson [place] on: 02/16/2021 02:39 PM   Modules accepted: Orders

## 2021-02-16 NOTE — Telephone Encounter (Signed)
Patient is calling again to get medication for her UTI.  She stated the first script that was sent for Cipro is on back order and the pharmacy said the doctor would need to send another antibiotic.  Please advise.

## 2021-04-29 IMAGING — MG DIGITAL SCREENING BILAT W/ TOMO W/ CAD
8 series · 8 of 24 positions shown · non-contrast
Comparison: Previous exam(s).

CLINICAL DATA: Screening.  Recent left arm Covid booster.

EXAM:
DIGITAL SCREENING BILATERAL MAMMOGRAM WITH TOMO AND CAD

[L MLO synth-2D]
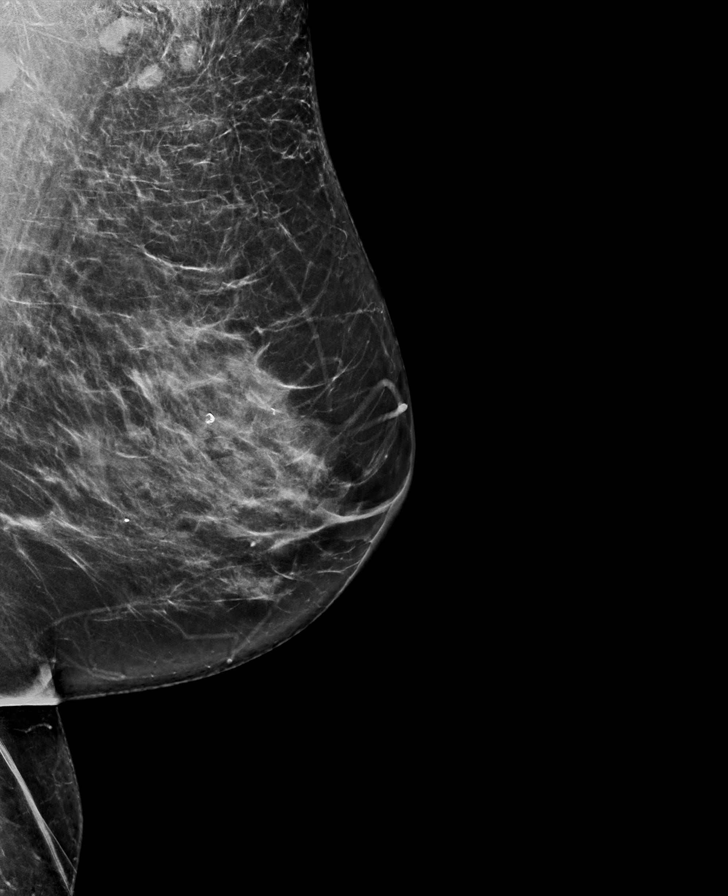

[L CC synth-2D]
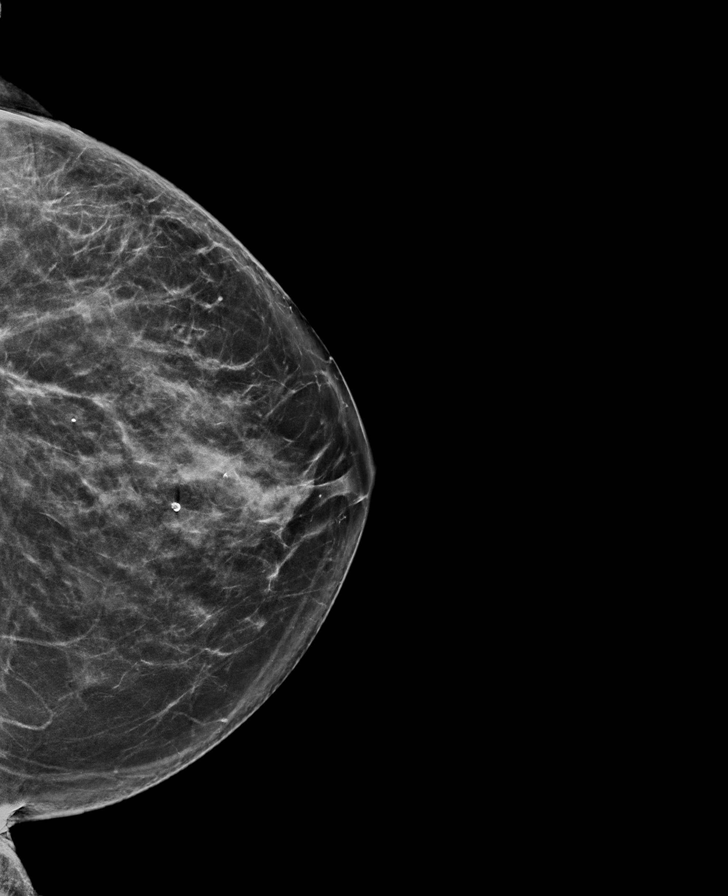

[R MLO synth-2D]
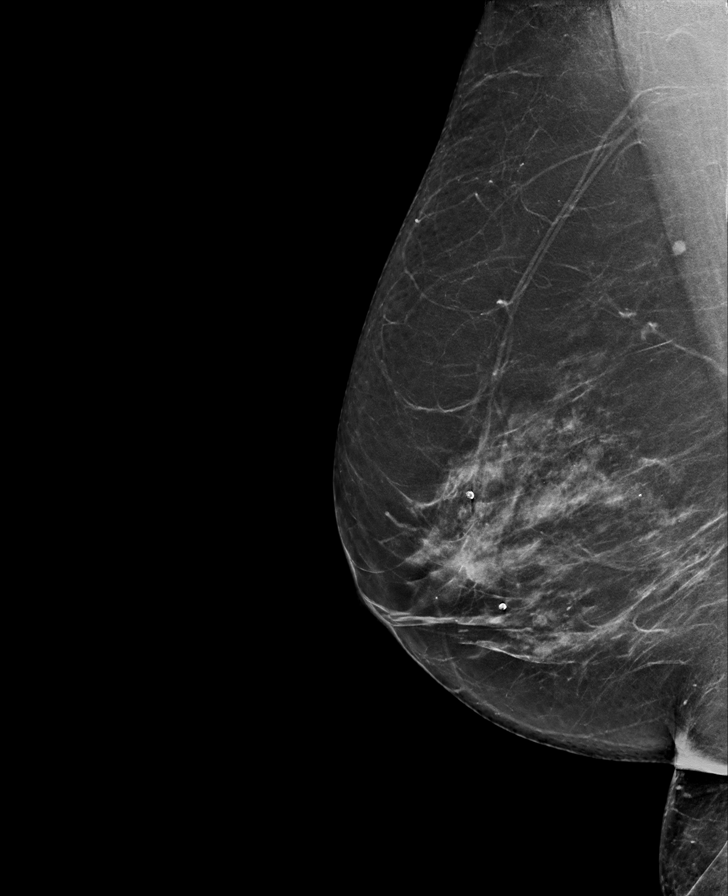

[R CC synth-2D]
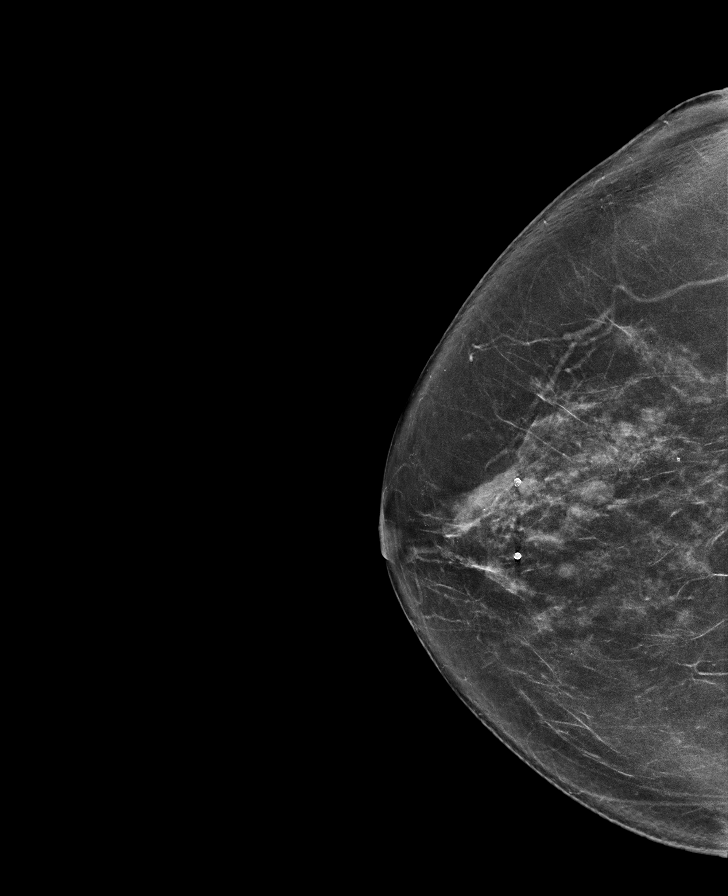

[R MLO tomo · tomo slice 37/73.0]
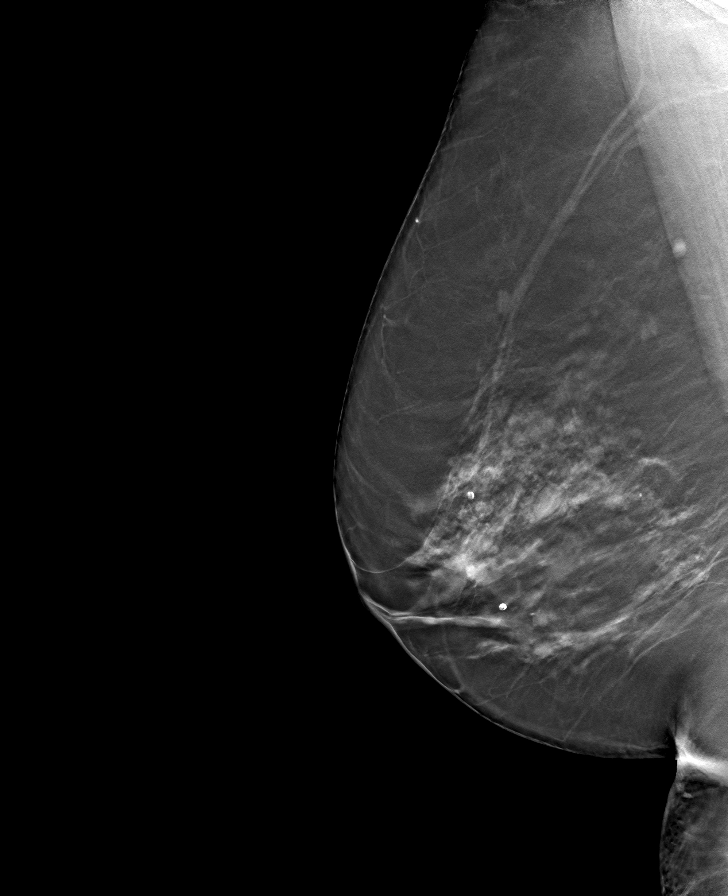

[L CC tomo · tomo slice 39/78.0]
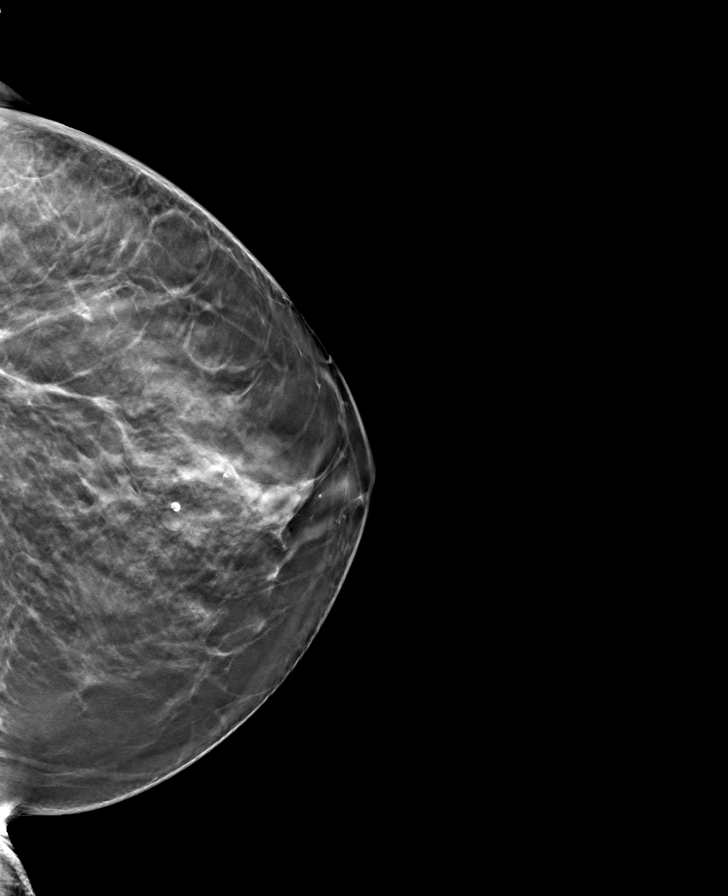

[L MLO tomo · tomo slice 39/77.0]
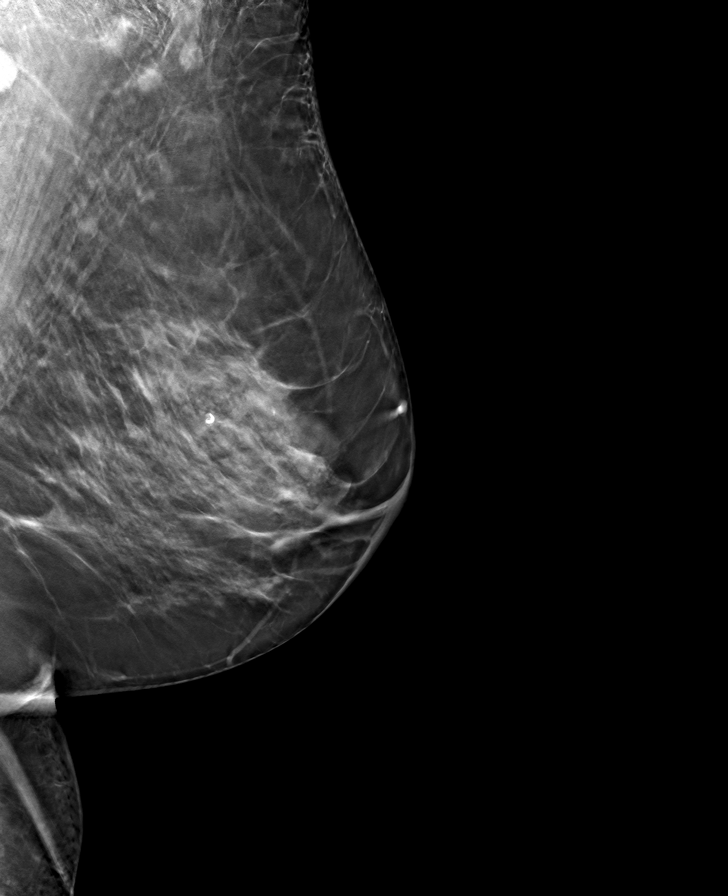

[R CC tomo · tomo slice 37/72.0]
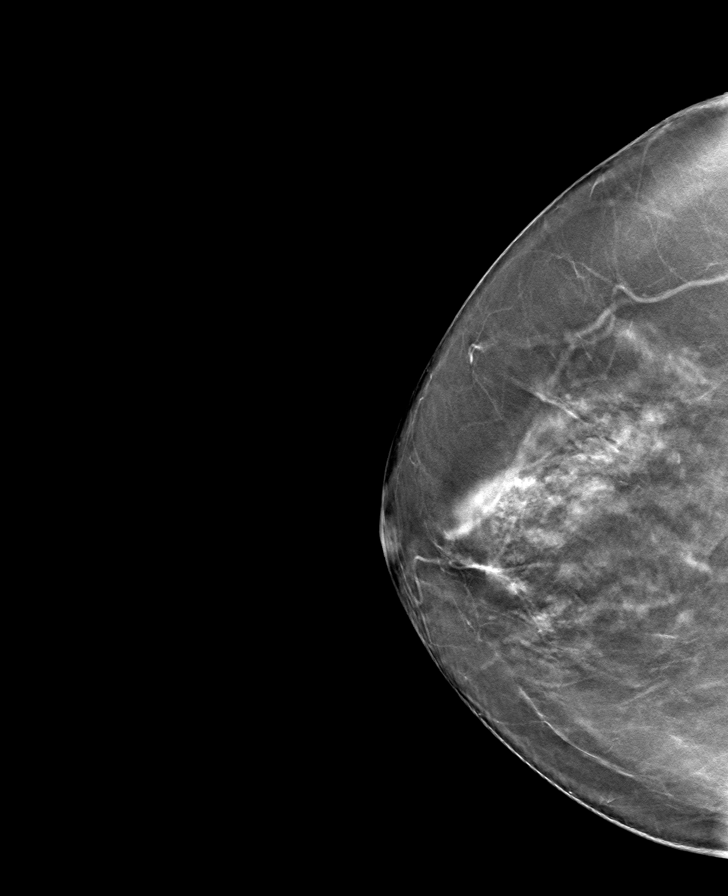

[8 of 24 positions shown; findings below may reference images not displayed]

ACR Breast Density Category c: The breast tissue is heterogeneously
dense, which may obscure small masses.
FINDINGS: In the left breast, a possible asymmetry in the posterior upper
outer breast and enlarged axillary lymph nodes warrant further
evaluation. In the right breast, no findings suspicious for
malignancy. Images were processed with CAD.
IMPRESSION: Further evaluation is suggested for possible asymmetry in the left
breast and enlarged left axillary lymph nodes.

RECOMMENDATION:
Diagnostic mammogram and ultrasound of the left breast and axilla.
(Code:[id number])

The patient will be contacted regarding the findings, and additional
imaging will be scheduled.

BI-RADS CATEGORY  0: Incomplete. Need additional imaging evaluation
and/or prior mammograms for comparison.

## 2021-05-06 ENCOUNTER — Other Ambulatory Visit: Payer: Self-pay | Admitting: Family Medicine

## 2021-05-06 DIAGNOSIS — F411 Generalized anxiety disorder: Secondary | ICD-10-CM

## 2021-05-06 NOTE — Telephone Encounter (Signed)
Requested medication (s) are due for refill today: yes  Requested medication (s) are on the active medication list: yes  Last refill:  02/07/21 #90 with 1 refill  Future visit scheduled: no  Notes to clinic:  Please review for refill. Refill not delegated per protocol.     Requested Prescriptions  Pending Prescriptions Disp Refills   ALPRAZolam (XANAX) 0.5 MG tablet [Pharmacy Med Name: ALPRAZOLAM 0.5 MG TABLET] 90 tablet 1    Sig: Take 1 tablet (0.5 mg total) by mouth every 8 (eight) hours as needed. for anxiety      Not Delegated - Psychiatry:  Anxiolytics/Hypnotics Failed - 05/06/2021  9:25 AM      Failed - This refill cannot be delegated      Failed - Urine Drug Screen completed in last 360 days      Passed - Valid encounter within last 6 months    Recent Outpatient Visits           2 months ago Acute cystitis without hematuria   [facility] Maple Hudson., MD   5 months ago Chronic pain of both knees   [facility] [place], [place], [place]   8 months ago Injury of left ankle, initial encounter   [facility] [place], [place], [place]   1 year ago Sciatica of right side   [facility] [place], [place], [place]   1 year ago Nasal congestion   [facility] [place], [place], [place]

## 2021-05-09 NOTE — Telephone Encounter (Signed)
PT called saying she would be out of her xanax today.  She is checking on the status.  [phone]

## 2021-05-23 ENCOUNTER — Telehealth: Payer: Self-pay | Admitting: Family Medicine

## 2021-05-23 DIAGNOSIS — F411 Generalized anxiety disorder: Secondary | ICD-10-CM

## 2021-05-23 NOTE — Telephone Encounter (Signed)
Requested medication (s) are due for refill today - o  Requested medication (s) are on the active medication list -yes  Future visit scheduled -no  Last refill: 05/20/21 #30  Notes to clinic: Request RF- non delegated Rx  Requested Prescriptions  Pending Prescriptions Disp Refills   ALPRAZolam (XANAX) 0.5 MG tablet [Pharmacy Med Name: ALPRAZOLAM 0.5 MG TABLET] 30 tablet 0    Sig: TAKE 1 TABLET (0.5 MG TOTAL) BY MOUTH EVERY 8 (EIGHT) HOURS AS NEEDED. FOR ANXIETY     Not Delegated - Psychiatry:  Anxiolytics/Hypnotics Failed - 05/23/2021 12:14 PM      Failed - This refill cannot be delegated      Failed - Urine Drug Screen completed in last 360 days      Passed - Valid encounter within last 6 months    Recent Outpatient Visits           3 months ago Acute cystitis without hematuria   [facility] Maple Hudson., MD   5 months ago Chronic pain of both knees   [facility] [place], [place], [place]   8 months ago Injury of left ankle, initial encounter   [facility] [place], [place], [place]   1 year ago Sciatica of right side   [facility] [place], [place], [place]   1 year ago Nasal congestion   [facility] Joycelyn [place], [place]                 Requested Prescriptions  Pending Prescriptions Disp Refills   ALPRAZolam (XANAX) 0.5 MG tablet [Pharmacy Med Name: ALPRAZOLAM 0.5 MG TABLET] 30 tablet 0    Sig: TAKE 1 TABLET (0.5 MG TOTAL) BY MOUTH EVERY 8 (EIGHT) HOURS AS NEEDED. FOR ANXIETY     Not Delegated - Psychiatry:  Anxiolytics/Hypnotics Failed - 05/23/2021 12:14 PM      Failed - This refill cannot be delegated      Failed - Urine Drug Screen completed in last 360 days      Passed - Valid encounter within last 6 months    Recent Outpatient Visits           3 months ago Acute cystitis without hematuria   [facility] Maple Hudson., MD   5 months ago  Chronic pain of both knees   [facility] [place], [place], [place]   8 months ago Injury of left ankle, initial encounter   [facility] [place], [place], [place]   1 year ago Sciatica of right side   [facility] [place], [place], [place]   1 year ago Nasal congestion   [facility] [place], [place], [place]

## 2021-05-23 NOTE — Telephone Encounter (Signed)
Call to office- appointment scheduled for 8/24 10:40 this week for medication follow up.

## 2021-05-23 NOTE — Telephone Encounter (Signed)
Patient called about medication  zanax being refused, notes say dx coide needed and cant exceed 5 refills. Please call b

## 2021-05-25 ENCOUNTER — Ambulatory Visit (INDEPENDENT_AMBULATORY_CARE_PROVIDER_SITE_OTHER): Payer: Medicare HMO | Admitting: Family Medicine

## 2021-05-25 ENCOUNTER — Encounter: Payer: Self-pay | Admitting: Family Medicine

## 2021-05-25 ENCOUNTER — Telehealth: Payer: Self-pay

## 2021-05-25 ENCOUNTER — Other Ambulatory Visit: Payer: Self-pay

## 2021-05-25 VITALS — BP 134/95 | HR 80 | Temp 98.1°F | Ht 67.0 in

## 2021-05-25 DIAGNOSIS — F411 Generalized anxiety disorder: Secondary | ICD-10-CM

## 2021-05-25 DIAGNOSIS — R030 Elevated blood-pressure reading, without diagnosis of hypertension: Secondary | ICD-10-CM | POA: Diagnosis not present

## 2021-05-25 DIAGNOSIS — F3342 Major depressive disorder, recurrent, in full remission: Secondary | ICD-10-CM

## 2021-05-25 MED ORDER — ALPRAZOLAM 0.5 MG PO TABS
0.5000 mg | ORAL_TABLET | Freq: Two times a day (BID) | ORAL | 0 refills | Status: AC | PRN
Start: 2021-05-25 — End: 2021-08-23

## 2021-05-25 MED ORDER — FLUOXETINE HCL 40 MG PO CAPS: 40.0000 mg | ORAL_CAPSULE | Freq: Every day | ORAL | 3 refills | Status: AC

## 2021-05-25 MED ORDER — HYDROXYZINE HCL 10 MG PO TABS: 10.0000 mg | ORAL_TABLET | Freq: Three times a day (TID) | ORAL | 1 refills | Status: AC | PRN

## 2021-05-25 MED ORDER — ALPRAZOLAM 0.5 MG PO TABS: 0.5000 mg | ORAL_TABLET | Freq: Two times a day (BID) | ORAL | 2 refills | Status: DC | PRN

## 2021-05-25 NOTE — Telephone Encounter (Signed)
Copied from CRM [id number]. Topic: Appointment Scheduling - Scheduling Inquiry for Clinic >> May 25, 2021  5:14 PM Randol Kern wrote: Reason for CRM: pt wants to be seen with Dr. Beryle Flock, does not want to return and see Robynn Pane payne. She prefers a MD not a NP.   Best contact: [phone] >> May 25, 2021  5:19 PM Randol Kern wrote: Pt is very uncomfortable with NP Merita Norton and prefers Dr. Leonard Schwartz -states Victorino Dike told her that she would have Dr. Leonard Schwartz

## 2021-05-25 NOTE — Assessment & Plan Note (Signed)
Ups/downs Start reduction of xanax- 60tabs/90 days Add hydroxyzine- pt plans to start on w/e Recommend re-establish counseling/CBT

## 2021-05-25 NOTE — Assessment & Plan Note (Signed)
BP elevated x2 Both DBPs in 90s; second SBP in 120s Pt notes that she is rushing from work to office for appt and frustrated that medications are handled differently between providers

## 2021-05-25 NOTE — Assessment & Plan Note (Signed)
Chronic long term use of xanax Recommend taper Pt visibly frustrated

## 2021-05-25 NOTE — Progress Notes (Signed)
Established patient visit   Patient: Marie Foley   DOB: 1955-12-19   65 y.o. Female  MRN: [id number] Visit Date: 05/25/2021  Today's healthcare provider: Jacky Kindle, FNP   Chief Complaint  Patient presents with   Follow-up   Anxiety   Subjective  -------------------------------------------------------------------------------------------------------------------- HPI  Anxiety, Follow-up  She was last seen for anxiety 8 months ago. Changes made at last visit include FLUoxetine (PROZAC) 40 MG capsule; Take 1 capsule (40 mg total) by mouth daily.    She reports excellent compliance with treatment. She reports excellent tolerance of treatment. She is not having side effects.  She feels her anxiety is mild and Improved since last visit.  Symptoms: No chest pain No difficulty concentrating  No dizziness No fatigue  No feelings of losing control No insomnia  Yes irritable No palpitations  Yes panic attacks No racing thoughts  No shortness of breath Yes sweating  No tremors/shakes    GAD-7 Results No flowsheet data found.  PHQ-9 Scores PHQ9 SCORE ONLY 05/25/2021 11/30/2020 09/06/2020  PHQ-9 Total Score 5 3 3     ---------------------------------------------------------------------------------------------------   Patient Active Problem List   Diagnosis Date Noted   GAD (generalized anxiety disorder) 05/25/2021   Blood pressure elevated without history of HTN 05/25/2021   Elevated hemoglobin A1c 07/10/2018   Hypercholesterolemia 07/10/2018   Clinical depression 10/25/2015   Blood in the urine 10/25/2015   Avitaminosis D 10/25/2015   Calcification of left breast 07/27/2015   Anxiety disorder 04/26/2015   Past Medical History:  Diagnosis Date   Anxiety    Allergies  Allergen Reactions   Bactrim [Sulfamethoxazole-Trimethoprim] Nausea And Vomiting   Diclofenac-Misoprostol    Macrobid [Nitrofurantoin Macrocrystal] Nausea And Vomiting        Medications: Outpatient Medications Prior to Visit  Medication Sig   fluticasone (FLONASE) 50 MCG/ACT nasal spray PLACE 1 SPRAY INTO BOTH NOSTRILS 2 (TWO) TIMES DAILY.   [DISCONTINUED] ALPRAZolam (XANAX) 0.5 MG tablet TAKE 1 TABLET (0.5 MG TOTAL) BY MOUTH EVERY 8 (EIGHT) HOURS AS NEEDED. FOR ANXIETY   [DISCONTINUED] FLUoxetine (PROZAC) 40 MG capsule Take 1 capsule (40 mg total) by mouth daily.   [DISCONTINUED] Fluticasone-Salmeterol (ADVAIR) 250-50 MCG/DOSE AEPB Inhale 1 puff into the lungs as needed.    [DISCONTINUED] methylPREDNISolone (MEDROL) 4 MG TBPK tablet 6 day taper; take as directed on package instructions   [DISCONTINUED] conjugated estrogens (PREMARIN) vaginal cream Place 1 Applicatorful vaginally daily. May decrease to three times per week, then once weekly as symptoms improve (Patient not taking: Reported on 05/25/2021)   [DISCONTINUED] cyclobenzaprine (FLEXERIL) 5 MG tablet Take 1 tablet (5 mg total) by mouth 3 (three) times daily as needed for muscle spasms. (Patient not taking: Reported on 05/25/2021)   [DISCONTINUED] ibuprofen (ADVIL) 600 MG tablet Take 1 tablet (600 mg total) by mouth every 8 (eight) hours as needed. (Patient not taking: Reported on 05/25/2021)   No facility-administered medications prior to visit.    Review of Systems  Last CBC Lab Results  Component Value Date   WBC 10.3 07/10/2018   HGB 12.9 07/10/2018   HCT 39.1 07/10/2018   MCV 91 07/10/2018   MCH 30.1 07/10/2018   RDW 13.0 07/10/2018   PLT 448 07/10/2018   Last metabolic panel Lab Results  Component Value Date   GLUCOSE 88 07/10/2018   NA 139 07/10/2018   K 4.5 07/10/2018   CL 99 07/10/2018   CO2 24 07/10/2018   BUN 12 07/10/2018  CREATININE 0.72 07/10/2018   GFRNONAA 90 07/10/2018   GFRAA 104 07/10/2018   CALCIUM 10.1 07/10/2018   PROT 6.9 07/10/2018   ALBUMIN 4.6 07/10/2018   LABGLOB 2.3 07/10/2018   AGRATIO 2.0 07/10/2018   BILITOT 0.5 07/10/2018   ALKPHOS 105 07/10/2018    AST 17 07/10/2018   ALT 17 07/10/2018   ANIONGAP 8 09/12/2014   Last lipids Lab Results  Component Value Date   CHOL 216 (H) 07/10/2018   HDL 61 07/10/2018   LDLCALC 138 (H) 07/10/2018   TRIG 85 07/10/2018   CHOLHDL 3.5 07/10/2018   Last hemoglobin A1c Lab Results  Component Value Date   HGBA1C 6.1 (H) 07/10/2018   Last thyroid functions Lab Results  Component Value Date   TSH 1.840 07/10/2018   Last vitamin D Lab Results  Component Value Date   VD25OH 36.4 07/10/2018   Last vitamin B12 and Folate Lab Results  Component Value Date   VITAMINB12 337 11/22/2016       Objective  -------------------------------------------------------------------------------------------------------------------- BP (!) 134/95 (BP Location: Right Arm, Patient Position: Sitting, Cuff Size: Large)   Pulse 80   Temp 98.1 F (36.7 C) (Oral)   Ht 5\' 7"  (1.702 m)   SpO2 96%   BMI 36.02 kg/m  BP Readings from Last 3 Encounters:  05/25/21 (!) 134/95  11/30/20 130/84  09/06/20 (!) 135/91   Wt Readings from Last 3 Encounters:  07/10/18 230 lb (104.3 kg)  07/02/17 231 lb (104.8 kg)  11/22/16 230 lb 9.6 oz (104.6 kg)       Physical Exam Vitals and nursing note reviewed.  Constitutional:      General: She is not in acute distress.    Appearance: Normal appearance. She is obese. She is not ill-appearing, toxic-appearing or diaphoretic.  Cardiovascular:     Rate and Rhythm: Normal rate and regular rhythm.     Pulses: Normal pulses.     Heart sounds: Normal heart sounds. No murmur heard.   No friction rub. No gallop.  Pulmonary:     Effort: Pulmonary effort is normal. No respiratory distress.     Breath sounds: Normal breath sounds. No stridor. No wheezing, rhonchi or rales.  Chest:     Chest wall: No tenderness.  Musculoskeletal:        General: Swelling present.     Right lower leg: Edema present.     Left lower leg: Edema present.  Skin:    General: Skin is warm and dry.      Capillary Refill: Capillary refill takes less than 2 seconds.  Neurological:     General: No focal deficit present.     Mental Status: She is alert and oriented to person, place, and time. Mental status is at baseline.     Cranial Nerves: No cranial nerve deficit.     Sensory: No sensory deficit.     Motor: No weakness.     Coordination: Coordination normal.     Gait: Gait normal.     Deep Tendon Reflexes: Reflexes normal.  Psychiatric:        Mood and Affect: Mood normal. Affect is labile.        Speech: Speech normal.        Behavior: Behavior normal. Behavior is cooperative.        Thought Content: Thought content normal.        Cognition and Memory: Cognition and memory normal.        Judgment: Judgment normal.  No results found for any visits on 05/25/21.  Assessment & Plan  ---------------------------------------------------------------------------------------------------------------------- Problem List Items Addressed This Visit       Other   Anxiety disorder    Chronic long term use of xanax Recommend taper Pt visibly frustrated      Relevant Medications   FLUoxetine (PROZAC) 40 MG capsule   ALPRAZolam (XANAX) 0.5 MG tablet   hydrOXYzine (ATARAX/VISTARIL) 10 MG tablet   Clinical depression    Loss of husband Estranged relations with son and his family      Relevant Medications   FLUoxetine (PROZAC) 40 MG capsule   ALPRAZolam (XANAX) 0.5 MG tablet   hydrOXYzine (ATARAX/VISTARIL) 10 MG tablet   GAD (generalized anxiety disorder) - Primary    Ups/downs Start reduction of xanax- 60tabs/90 days Add hydroxyzine- pt plans to start on w/e Recommend re-establish counseling/CBT      Relevant Medications   FLUoxetine (PROZAC) 40 MG capsule   ALPRAZolam (XANAX) 0.5 MG tablet   hydrOXYzine (ATARAX/VISTARIL) 10 MG tablet   Blood pressure elevated without history of HTN    BP elevated x2 Both DBPs in 90s; second SBP in 120s Pt notes that she is rushing from  work to office for appt and frustrated that medications are handled differently between providers        Return in about 6 months (around 11/25/2021) for anxiety and depression.      Leilani Merl, FNP, have reviewed all documentation for this visit. The documentation on 05/25/21 for the exam, diagnosis, procedures, and orders are all accurate and complete.    Marie Kindle, FNP  [facility] [phone] (phone) [phone] (fax)  [facility]

## 2021-05-25 NOTE — Assessment & Plan Note (Signed)
Loss of husband Estranged relations with son and his family

## 2021-05-26 ENCOUNTER — Encounter: Payer: Self-pay | Admitting: Family Medicine

## 2021-05-26 NOTE — Telephone Encounter (Signed)
I cannot take on new patients currently. She is welcome to see another new provider in October. Sarah, service recovery.

## 2021-05-26 NOTE — Telephone Encounter (Signed)
Explained to this patient multiple times that Dr. B is not able to take on anymore patients.  Advised her that if she was not happy with Robynn Pane that she could call back and schedule with one of the new providers later in the year.  She agreed to be put on Elise's schedule and would call back to be put on new provider's schedule later in the year if she decided to do that.

## 2021-05-30 NOTE — Telephone Encounter (Signed)
Service recovery please, Maralyn Sago.  She is welcome to wait until November to establish with a new hire, but they are not doctors either. None of the physicians are able to assume more patients at this time. Robynn Pane, please do not let this get to you.

## 2021-06-09 ENCOUNTER — Telehealth: Payer: Self-pay

## 2021-06-09 NOTE — Telephone Encounter (Signed)
Copied from CRM [id number]. Topic: General - Other >> Jun 09, 2021  4:44 PM Gaetana Michaelis A wrote: Reason for CRM: The patient has called to request being seen as a patient of Dr. Beryle Flock  Please contact further when possible regarding reassignment and scheduling

## 2021-06-10 NOTE — Telephone Encounter (Signed)
Contacted patient and advised as below, she became very upset on the phone raising her voice stating " You dont know me she dont know me or what I been through, my family members have passed away I do not need counseling or seeing a psychiatrist. She cant just change my medication without telling me. Im at work and she needs to explain this better to me, have her contact me back." KW

## 2021-06-10 NOTE — Telephone Encounter (Signed)
Patient advised that Dr. Beryle Flock is not taking new patients at the moment. Advised also that we will have two other providers joining the office. Patient reports that hydroxyzine did not help with her anxiety. Patient reports it "knocked her out" and made her "jittery". Patient reports she is taking prozac and xanax now. Please advise.

## 2021-06-13 NOTE — Telephone Encounter (Signed)
Seems like this one needs service recovery Sarah.  I think that from the chart, Robynn Pane did discuss the change in her medications and has offered many other resources.

## 2021-11-24 NOTE — Progress Notes (Deleted)
**Note Marie-Identified via Obfuscation** °  ° °  Vivien Rota Ayra Hodgdon,acting as a scribe for Jacky Kindle, FNP.,have documented all relevant documentation on the behalf of Jacky Kindle, FNP,as directed by  Jacky Kindle, FNP while in the presence of Jacky Kindle, FNP.   Established patient visit   Patient: Marie Foley   DOB: 09-13-1956   66 y.o. Female  MRN: [id number] Visit Date: 11/25/2021  Today's healthcare provider: Jacky Kindle, FNP   No chief complaint on file.  Subjective    HPI  Anxiety, Follow-up  She was last seen for anxiety 6 months ago. Changes made at last visit include started reducing Xanax and added Hydroxyzine.   She reports {excellent/good/fair/poor:19665} compliance with treatment. She reports {good/fair/poor:18685} tolerance of treatment. She {is/is not:21021397} having side effects. {document side effects if present:1}  She feels her anxiety is {Desc; severity:60313} and {improved/worse/unchanged:3041574} since last visit.  Symptoms: {Yes/No:20286} chest pain {Yes/No:20286} difficulty concentrating  {Yes/No:20286} dizziness {Yes/No:20286} fatigue  {Yes/No:20286} feelings of losing control {Yes/No:20286} insomnia  {Yes/No:20286} irritable {Yes/No:20286} palpitations  {Yes/No:20286} panic attacks {Yes/No:20286} racing thoughts  {Yes/No:20286} shortness of breath {Yes/No:20286} sweating  {Yes/No:20286} tremors/shakes    GAD-7 Results No flowsheet data found.  PHQ-9 Scores PHQ9 SCORE ONLY 05/25/2021 11/30/2020 09/06/2020  PHQ-9 Total Score 5 3 3     ---------------------------------------------------------------------------------------------------   Medications: Outpatient Medications Prior to Visit  Medication Sig   FLUoxetine (PROZAC) 40 MG capsule Take 1 capsule (40 mg total) by mouth daily.   fluticasone (FLONASE) 50 MCG/ACT nasal spray PLACE 1 SPRAY INTO BOTH NOSTRILS 2 (TWO) TIMES DAILY.   hydrOXYzine (ATARAX/VISTARIL) 10 MG tablet Take 1 tablet (10 mg total) by mouth 3 (three) times  daily as needed.   No facility-administered medications prior to visit.    Review of Systems  {Labs   Heme   Chem   Endocrine   Serology   Results Review (optional):23779}   Objective    There were no vitals taken for this visit. {Show previous vital signs (optional):23777}  Physical Exam  ***  No results found for any visits on 11/25/21.  Assessment & Plan     ***  No follow-ups on file.      {provider attestation***:1}   11/27/21, FNP  [facility] [phone] (phone) [phone] (fax)  [facility]

## 2021-11-25 ENCOUNTER — Ambulatory Visit: Payer: Medicare HMO | Admitting: Family Medicine

## 2021-12-28 ENCOUNTER — Other Ambulatory Visit: Payer: Self-pay | Admitting: Infectious Diseases

## 2021-12-28 DIAGNOSIS — Z1231 Encounter for screening mammogram for malignant neoplasm of breast: Secondary | ICD-10-CM

## 2022-03-14 ENCOUNTER — Other Ambulatory Visit: Payer: Self-pay | Admitting: Orthopedic Surgery

## 2022-03-21 NOTE — Patient Instructions (Signed)
Your procedure is scheduled on: Thursday March 30, 2022. Report to Day Surgery inside [facility] 2nd floor, stop by admissions desk before getting on elevator. To find out your arrival time please call [phone] between 1PM - 3PM on Wednesday March 29, 2022.  Remember: Instructions that are not followed completely may result in serious medical risk,  up to and including death, or upon the discretion of your surgeon and anesthesiologist your  surgery may need to be rescheduled.     _X__ 1. Do not eat food after midnight the night before your procedure.                 No chewing gum or hard candies. You may drink clear liquids up to 2 hours                 before you are scheduled to arrive for your surgery- DO not drink clear                 liquids within 2 hours of the start of your surgery.                 Clear Liquids include:  water, apple juice without pulp, clear Gatorade, G2 or                  Gatorade Zero (avoid Red/Purple/Blue), Black Coffee or Tea (Do not add                 anything to coffee or tea).  __X__2.   Complete the "Ensure Clear Pre-surgery Clear Carbohydrate Drink" provided to you, 2 hours before arrival. **If you are diabetic you will be provided with an alternative drink, Gatorade Zero or G2.  __X__3.  On the morning of surgery brush your teeth with toothpaste and water, you                may rinse your mouth with mouthwash if you wish.  Do not swallow any toothpaste of mouthwash.     _X__ 4.  No Alcohol for 24 hours before or after surgery.   _X__ 5.  Do Not Smoke or use e-cigarettes For 24 Hours Prior to Your Surgery.                 Do not use any chewable tobacco products for at least 6 hours prior to                 Surgery.  _X__  6.  Do not use any recreational drugs (marijuana, cocaine, heroin, ecstasy, MDMA or other)                For at least one week prior to your surgery.  Combination of these drugs with anesthesia                 May have life threatening results.  ____  7.  Bring all medications with you on the day of surgery if instructed.   __X__8.  Notify your doctor if there is any change in your medical condition      (cold, fever, infections).     Do not wear jewelry, make-up, hairpins, clips or nail polish. Do not wear lotions, powders, or perfumes. You may wear deodorant. Do not shave 48 hours prior to surgery. Men may shave face and neck. Do not bring valuables to the hospital.    [facility] is not responsible for any belongings or valuables.  Contacts, dentures  or bridgework may not be worn into surgery. Leave your suitcase in the car. After surgery it may be brought to your room. For patients admitted to the hospital, discharge time is determined by your treatment team.   Patients discharged the day of surgery will not be allowed to drive home.   Make arrangements for someone to be with you for the first 24 hours of your Same Day Discharge.   __X__ Take these medicines the morning of surgery with A SIP OF WATER:    1. FLUoxetine (PROZAC) 40 MG  2. ALPRAZolam (XANAX) 0.5 MG  3. omeprazole (PRILOSEC) 40 MG  4.  5.  6.  ____ Fleet Enema (as directed)   __X__ Use CHG Soap (or wipes) as directed  ____ Use Benzoyl Peroxide Gel as instructed  ____ Use inhalers on the day of surgery  ____ Stop metformin 2 days prior to surgery    ____ Take 1/2 of usual insulin dose the night before surgery. No insulin the morning          of surgery.   ____ Call your PCP, cardiologist, or Pulmonologist if taking Coumadin/Plavix/aspirin and ask when to stop before your surgery.   __X__ One Week prior to surgery- Stop Anti-inflammatories such as Ibuprofen, Aleve, Advil, Motrin, meloxicam (MOBIC), diclofenac, etodolac, ketorolac, Toradol, Daypro, piroxicam, Goody's or BC powders. OK TO USE TYLENOL IF NEEDED   __X__ Do not start any vitamins and or supplements until after surgery.    ____ Bring  C-Pap to the hospital.    If you have any questions regarding your pre-procedure instructions,  Please call Pre-admit Testing at [phone]

## 2022-03-22 ENCOUNTER — Encounter
Admission: RE | Admit: 2022-03-22 | Discharge: 2022-03-22 | Disposition: A | Discharge status: Home / Self Care | Payer: Medicare HMO | Source: Ambulatory Visit | Attending: Orthopedic Surgery | Admitting: Orthopedic Surgery

## 2022-03-22 VITALS — BP 141/97 | HR 90 | Temp 98.5°F | Resp 16 | Ht 67.0 in | Wt 247.0 lb

## 2022-03-22 DIAGNOSIS — Z87440 Personal history of urinary (tract) infections: Secondary | ICD-10-CM | POA: Insufficient documentation

## 2022-03-22 DIAGNOSIS — Z01818 Encounter for other preprocedural examination: Secondary | ICD-10-CM | POA: Insufficient documentation

## 2022-03-22 DIAGNOSIS — Z01812 Encounter for preprocedural laboratory examination: Secondary | ICD-10-CM

## 2022-03-22 HISTORY — DX: Unspecified osteoarthritis, unspecified site: M19.90

## 2022-03-22 LAB — COMPREHENSIVE METABOLIC PANEL
ALT: 21 U/L (ref 0–44)
AST: 20 U/L (ref 15–41)
Albumin: 3.8 g/dL (ref 3.5–5.0)
Alkaline Phosphatase: 99 U/L (ref 38–126)
Anion gap: 7 (ref 5–15)
BUN: 11 mg/dL (ref 8–23)
CO2: 29 mmol/L (ref 22–32)
Calcium: 9.7 mg/dL (ref 8.9–10.3)
Chloride: 101 mmol/L (ref 98–111)
Creatinine, Ser: 0.56 mg/dL (ref 0.44–1.00)
GFR, Estimated: >60 mL/min (ref >60)
Glucose, Bld: 105 mg/dL — ABNORMAL HIGH (ref 70–99)
Potassium: 3.9 mmol/L (ref 3.5–5.1)
Sodium: 137 mmol/L (ref 135–145)
Total Bilirubin: 0.5 mg/dL (ref 0.3–1.2)
Total Protein: 7.4 g/dL (ref 6.5–8.1)

## 2022-03-22 LAB — CBC WITH DIFFERENTIAL/PLATELET
Abs Immature Granulocytes: 0.02 10*3/uL (ref 0.00–0.07)
Basophils Absolute: 0.1 10*3/uL (ref 0.0–0.1)
Basophils Relative: 1 %
Eosinophils Absolute: 0.5 10*3/uL (ref 0.0–0.5)
Eosinophils Relative: 6 %
HCT: 42.2 % (ref 36.0–46.0)
Hemoglobin: 13.7 g/dL (ref 12.0–15.0)
Immature Granulocytes: 0 %
Lymphocytes Relative: 25 %
Lymphs Abs: 2.3 10*3/uL (ref 0.7–4.0)
MCH: 30.9 pg (ref 26.0–34.0)
MCHC: 32.5 g/dL (ref 30.0–36.0)
MCV: 95.3 fL (ref 80.0–100.0)
Monocytes Absolute: 0.7 10*3/uL (ref 0.1–1.0)
Monocytes Relative: 7 %
Neutro Abs: 5.6 10*3/uL (ref 1.7–7.7)
Neutrophils Relative %: 61 %
Platelets: 391 10*3/uL (ref 150–400)
RBC: 4.43 MIL/uL (ref 3.87–5.11)
RDW: 12.9 % (ref 11.5–15.5)
WBC: 9.1 10*3/uL (ref 4.0–10.5)
nRBC: 0 % (ref 0.0–0.2)

## 2022-03-22 LAB — URINALYSIS, ROUTINE W REFLEX MICROSCOPIC
Bilirubin Urine: NEGATIVE
Glucose, UA: NEGATIVE mg/dL
Ketones, ur: NEGATIVE mg/dL
Nitrite: NEGATIVE
Protein, ur: NEGATIVE mg/dL
Specific Gravity, Urine: 1.006 (ref 1.005–1.030)
WBC, UA: >50 WBC/hpf — ABNORMAL HIGH (ref 0–5)
pH: 6 (ref 5.0–8.0)

## 2022-03-22 LAB — SURGICAL PCR SCREEN
MRSA, PCR: NEGATIVE
Staphylococcus aureus: NEGATIVE

## 2022-03-23 LAB — TYPE AND SCREEN
ABO/RH(D): A NEG
Antibody Screen: NEGATIVE

## 2022-03-24 LAB — URINE CULTURE: Culture: >=100000 — AB

## 2022-03-30 ENCOUNTER — Ambulatory Visit: Payer: Medicare HMO | Admitting: Urgent Care

## 2022-03-30 ENCOUNTER — Encounter: Payer: Self-pay | Admitting: Orthopedic Surgery

## 2022-03-30 ENCOUNTER — Observation Stay: Payer: Medicare HMO

## 2022-03-30 ENCOUNTER — Encounter
Admission: RE | Disposition: A | Discharge status: Home Health | Payer: Self-pay | Source: Home / Self Care | Attending: Orthopedic Surgery

## 2022-03-30 ENCOUNTER — Other Ambulatory Visit: Payer: Self-pay

## 2022-03-30 ENCOUNTER — Observation Stay
Admission: RE | Admit: 2022-03-30 | Discharge: 2022-04-04 | Disposition: A | Discharge status: Home Health | Payer: Medicare HMO | Attending: Orthopedic Surgery | Admitting: Orthopedic Surgery

## 2022-03-30 ENCOUNTER — Ambulatory Visit: Payer: Medicare HMO

## 2022-03-30 DIAGNOSIS — M8568 Other cyst of bone, other site: Secondary | ICD-10-CM | POA: Insufficient documentation

## 2022-03-30 DIAGNOSIS — Z9181 History of falling: Secondary | ICD-10-CM | POA: Insufficient documentation

## 2022-03-30 DIAGNOSIS — M1711 Unilateral primary osteoarthritis, right knee: Secondary | ICD-10-CM | POA: Diagnosis not present

## 2022-03-30 DIAGNOSIS — G4730 Sleep apnea, unspecified: Secondary | ICD-10-CM | POA: Insufficient documentation

## 2022-03-30 DIAGNOSIS — M25751 Osteophyte, right hip: Secondary | ICD-10-CM | POA: Insufficient documentation

## 2022-03-30 DIAGNOSIS — M1611 Unilateral primary osteoarthritis, right hip: Secondary | ICD-10-CM | POA: Diagnosis present

## 2022-03-30 DIAGNOSIS — Z90710 Acquired absence of both cervix and uterus: Secondary | ICD-10-CM | POA: Diagnosis not present

## 2022-03-30 DIAGNOSIS — M6281 Muscle weakness (generalized): Secondary | ICD-10-CM | POA: Insufficient documentation

## 2022-03-30 DIAGNOSIS — Z8781 Personal history of (healed) traumatic fracture: Secondary | ICD-10-CM | POA: Insufficient documentation

## 2022-03-30 DIAGNOSIS — Z1389 Encounter for screening for other disorder: Secondary | ICD-10-CM | POA: Diagnosis not present

## 2022-03-30 DIAGNOSIS — Z6838 Body mass index (BMI) 38.0-38.9, adult: Secondary | ICD-10-CM | POA: Insufficient documentation

## 2022-03-30 DIAGNOSIS — F411 Generalized anxiety disorder: Secondary | ICD-10-CM | POA: Insufficient documentation

## 2022-03-30 DIAGNOSIS — M25851 Other specified joint disorders, right hip: Secondary | ICD-10-CM | POA: Diagnosis not present

## 2022-03-30 DIAGNOSIS — R262 Difficulty in walking, not elsewhere classified: Secondary | ICD-10-CM | POA: Diagnosis not present

## 2022-03-30 DIAGNOSIS — R8290 Unspecified abnormal findings in urine: Secondary | ICD-10-CM | POA: Diagnosis not present

## 2022-03-30 DIAGNOSIS — R2689 Other abnormalities of gait and mobility: Secondary | ICD-10-CM | POA: Insufficient documentation

## 2022-03-30 DIAGNOSIS — Z96641 Presence of right artificial hip joint: Secondary | ICD-10-CM

## 2022-03-30 DIAGNOSIS — M25561 Pain in right knee: Secondary | ICD-10-CM | POA: Insufficient documentation

## 2022-03-30 DIAGNOSIS — Z01812 Encounter for preprocedural laboratory examination: Secondary | ICD-10-CM

## 2022-03-30 HISTORY — PX: TOTAL HIP ARTHROPLASTY: SHX124

## 2022-03-30 LAB — ABO/RH: ABO/RH(D): A NEG

## 2022-03-30 LAB — CBC
HCT: 40.5 % (ref 36.0–46.0)
Hemoglobin: 12.9 g/dL (ref 12.0–15.0)
MCH: 31.2 pg (ref 26.0–34.0)
MCHC: 31.9 g/dL (ref 30.0–36.0)
MCV: 98.1 fL (ref 80.0–100.0)
Platelets: 390 10*3/uL (ref 150–400)
RBC: 4.13 MIL/uL (ref 3.87–5.11)
RDW: 13 % (ref 11.5–15.5)
WBC: 15.5 10*3/uL — ABNORMAL HIGH (ref 4.0–10.5)
nRBC: 0 % (ref 0.0–0.2)

## 2022-03-30 LAB — CREATININE, SERUM
Creatinine, Ser: 0.54 mg/dL (ref 0.44–1.00)
GFR, Estimated: >60 mL/min (ref >60)

## 2022-03-30 SURGERY — ARTHROPLASTY, HIP, TOTAL, ANTERIOR APPROACH
Anesthesia: Spinal | Site: Hip | Laterality: Right

## 2022-03-30 MED ORDER — FLEET ENEMA 7-19 GM/118ML RE ENEM: 1.0000 | ENEMA | Freq: Once | RECTAL | Status: DC | PRN

## 2022-03-30 MED ORDER — ENOXAPARIN SODIUM 40 MG/0.4ML IJ SOSY
40.0000 mg | PREFILLED_SYRINGE | INTRAMUSCULAR | Status: DC
  Administered 2022-03-31 – 2022-04-04 (×5): 40 mg via SUBCUTANEOUS
  Filled 2022-03-30 (×5): qty 0.4

## 2022-03-30 MED ORDER — METHOCARBAMOL 500 MG PO TABS
500.0000 mg | ORAL_TABLET | Freq: Four times a day (QID) | ORAL | Status: DC | PRN
  Administered 2022-03-30: 500 mg via ORAL
  Filled 2022-03-30 (×2): qty 1

## 2022-03-30 MED ORDER — CEFAZOLIN SODIUM-DEXTROSE 2-4 GM/100ML-% IV SOLN
2.0000 g | INTRAVENOUS | Status: AC
  Administered 2022-03-30: 2 g via INTRAVENOUS

## 2022-03-30 MED ORDER — HYDROCODONE-ACETAMINOPHEN 5-325 MG PO TABS
ORAL_TABLET | ORAL | Status: AC
  Filled 2022-03-30: qty 1

## 2022-03-30 MED ORDER — TRAMADOL HCL 50 MG PO TABS: 50.0000 mg | ORAL_TABLET | Freq: Four times a day (QID) | ORAL | 0 refills | Status: AC | PRN

## 2022-03-30 MED ORDER — HYDROCODONE-ACETAMINOPHEN 5-325 MG PO TABS
1.0000 | ORAL_TABLET | ORAL | Status: DC | PRN
  Administered 2022-03-30: 2 via ORAL
  Administered 2022-03-30: 1 via ORAL
  Administered 2022-04-01 – 2022-04-04 (×5): 2 via ORAL
  Filled 2022-03-30 (×6): qty 2

## 2022-03-30 MED ORDER — DOCUSATE SODIUM 100 MG PO CAPS: 100.0000 mg | ORAL_CAPSULE | Freq: Two times a day (BID) | ORAL | 0 refills | Status: AC

## 2022-03-30 MED ORDER — SODIUM CHLORIDE (PF) 0.9 % IJ SOLN
INTRAMUSCULAR | Status: DC | PRN
  Administered 2022-03-30: 90 mL via INTRAMUSCULAR

## 2022-03-30 MED ORDER — MENTHOL 3 MG MT LOZG
1.0000 | LOZENGE | OROMUCOSAL | Status: DC | PRN
Start: 2022-03-30 — End: 2022-04-04

## 2022-03-30 MED ORDER — CHLORHEXIDINE GLUCONATE 0.12 % MT SOLN: 15.0000 mL | Freq: Once | OROMUCOSAL | Status: AC

## 2022-03-30 MED ORDER — ONDANSETRON HCL 4 MG PO TABS: 4.0000 mg | ORAL_TABLET | Freq: Four times a day (QID) | ORAL | Status: DC | PRN

## 2022-03-30 MED ORDER — DOCUSATE SODIUM 100 MG PO CAPS
100.0000 mg | ORAL_CAPSULE | Freq: Two times a day (BID) | ORAL | Status: DC
  Administered 2022-03-30 – 2022-04-02 (×7): 100 mg via ORAL
  Filled 2022-03-30 (×9): qty 1

## 2022-03-30 MED ORDER — 0.9 % SODIUM CHLORIDE (POUR BTL) OPTIME
TOPICAL | Status: DC | PRN
  Administered 2022-03-30: 1000 mL

## 2022-03-30 MED ORDER — ALUM & MAG HYDROXIDE-SIMETH 200-200-20 MG/5ML PO SUSP
30.0000 mL | ORAL | Status: DC | PRN
Start: 2022-03-30 — End: 2022-04-04

## 2022-03-30 MED ORDER — SURGIPHOR WOUND IRRIGATION SYSTEM - OPTIME: TOPICAL | Status: DC | PRN

## 2022-03-30 MED ORDER — METHOCARBAMOL 500 MG PO TABS: 500.0000 mg | ORAL_TABLET | Freq: Four times a day (QID) | ORAL | 0 refills | Status: AC | PRN

## 2022-03-30 MED ORDER — DIPHENHYDRAMINE HCL 12.5 MG/5ML PO ELIX: 12.5000 mg | ORAL_SOLUTION | ORAL | Status: DC | PRN

## 2022-03-30 MED ORDER — CEFAZOLIN SODIUM-DEXTROSE 2-4 GM/100ML-% IV SOLN
2.0000 g | Freq: Four times a day (QID) | INTRAVENOUS | Status: AC
  Administered 2022-03-30 (×2): 2 g via INTRAVENOUS
  Filled 2022-03-30 (×2): qty 100

## 2022-03-30 MED ORDER — METOCLOPRAMIDE HCL 5 MG PO TABS
5.0000 mg | ORAL_TABLET | Freq: Three times a day (TID) | ORAL | Status: DC | PRN
  Administered 2022-03-30: 10 mg via ORAL
  Filled 2022-03-30: qty 2

## 2022-03-30 MED ORDER — SODIUM CHLORIDE 0.9 % IV SOLN: INTRAVENOUS | Status: DC

## 2022-03-30 MED ORDER — ALPRAZOLAM 0.5 MG PO TABS
0.5000 mg | ORAL_TABLET | Freq: Every evening | ORAL | Status: DC | PRN
  Administered 2022-03-30 – 2022-04-01 (×2): 0.5 mg via ORAL
  Filled 2022-03-30 (×2): qty 1

## 2022-03-30 MED ORDER — ZOLPIDEM TARTRATE 5 MG PO TABS
5.0000 mg | ORAL_TABLET | Freq: Every evening | ORAL | Status: DC | PRN
  Administered 2022-03-31: 5 mg via ORAL
  Filled 2022-03-30: qty 1

## 2022-03-30 MED ORDER — PANTOPRAZOLE SODIUM 40 MG PO TBEC
40.0000 mg | DELAYED_RELEASE_TABLET | Freq: Every day | ORAL | Status: DC
  Administered 2022-03-31 – 2022-04-04 (×5): 40 mg via ORAL
  Filled 2022-03-30 (×5): qty 1

## 2022-03-30 MED ORDER — BISACODYL 5 MG PO TBEC
5.0000 mg | DELAYED_RELEASE_TABLET | Freq: Every day | ORAL | Status: DC | PRN
  Administered 2022-03-30 – 2022-04-02 (×3): 5 mg via ORAL
  Filled 2022-03-30 (×3): qty 1

## 2022-03-30 MED ORDER — PHENOL 1.4 % MT LIQD: 1.0000 | OROMUCOSAL | Status: DC | PRN

## 2022-03-30 MED ORDER — METOCLOPRAMIDE HCL 5 MG/ML IJ SOLN: 5.0000 mg | Freq: Three times a day (TID) | INTRAMUSCULAR | Status: DC | PRN

## 2022-03-30 MED ORDER — HYDROCODONE-ACETAMINOPHEN 7.5-325 MG PO TABS
1.0000 | ORAL_TABLET | ORAL | 0 refills | Status: AC | PRN
Start: 2022-03-30 — End: ?

## 2022-03-30 MED ORDER — SENNOSIDES-DOCUSATE SODIUM 8.6-50 MG PO TABS: 1.0000 | ORAL_TABLET | Freq: Every evening | ORAL | Status: DC | PRN

## 2022-03-30 MED ORDER — MIDAZOLAM HCL 5 MG/5ML IJ SOLN
INTRAMUSCULAR | Status: DC | PRN
  Administered 2022-03-30: 2 mg via INTRAVENOUS

## 2022-03-30 MED ORDER — BUPIVACAINE HCL (PF) 0.5 % IJ SOLN
INTRAMUSCULAR | Status: DC | PRN
  Administered 2022-03-30: 3 mL

## 2022-03-30 MED ORDER — MIDAZOLAM HCL 2 MG/2ML IJ SOLN
INTRAMUSCULAR | Status: AC
  Filled 2022-03-30: qty 2

## 2022-03-30 MED ORDER — MORPHINE SULFATE (PF) 2 MG/ML IV SOLN: 0.5000 mg | INTRAVENOUS | Status: DC | PRN

## 2022-03-30 MED ORDER — ACETAMINOPHEN 325 MG PO TABS: 325.0000 mg | ORAL_TABLET | Freq: Four times a day (QID) | ORAL | Status: DC | PRN

## 2022-03-30 MED ORDER — HYDROCODONE-ACETAMINOPHEN 7.5-325 MG PO TABS
1.0000 | ORAL_TABLET | Freq: Once | ORAL | Status: AC
  Administered 2022-03-30: 1 via ORAL
  Filled 2022-03-30: qty 1

## 2022-03-30 MED ORDER — FENTANYL CITRATE (PF) 100 MCG/2ML IJ SOLN
INTRAMUSCULAR | Status: AC
  Filled 2022-03-30: qty 2

## 2022-03-30 MED ORDER — METHOCARBAMOL 1000 MG/10ML IJ SOLN: 500.0000 mg | Freq: Four times a day (QID) | INTRAVENOUS | Status: DC | PRN

## 2022-03-30 MED ORDER — PHENYLEPHRINE 80 MCG/ML (10ML) SYRINGE FOR IV PUSH (FOR BLOOD PRESSURE SUPPORT)
PREFILLED_SYRINGE | INTRAVENOUS | Status: DC | PRN
  Administered 2022-03-30: 160 ug via INTRAVENOUS
  Administered 2022-03-30 (×2): 80 ug via INTRAVENOUS

## 2022-03-30 MED ORDER — ORAL CARE MOUTH RINSE: 15.0000 mL | Freq: Once | OROMUCOSAL | Status: AC

## 2022-03-30 MED ORDER — FENTANYL CITRATE (PF) 100 MCG/2ML IJ SOLN
25.0000 ug | INTRAMUSCULAR | Status: DC | PRN
  Administered 2022-03-30 (×4): 50 ug via INTRAVENOUS

## 2022-03-30 MED ORDER — SODIUM CHLORIDE 0.9 % IR SOLN
Status: DC | PRN
  Administered 2022-03-30: 250 mL

## 2022-03-30 MED ORDER — PROPOFOL 1000 MG/100ML IV EMUL
INTRAVENOUS | Status: AC
  Filled 2022-03-30: qty 100

## 2022-03-30 MED ORDER — HEMOSTATIC AGENTS (NO CHARGE) OPTIME
TOPICAL | Status: DC | PRN
  Administered 2022-03-30: 2 via TOPICAL

## 2022-03-30 MED ORDER — ONDANSETRON HCL 4 MG/2ML IJ SOLN
4.0000 mg | Freq: Four times a day (QID) | INTRAMUSCULAR | Status: DC | PRN
  Administered 2022-03-30: 4 mg via INTRAVENOUS
  Filled 2022-03-30: qty 2

## 2022-03-30 MED ORDER — EPHEDRINE SULFATE (PRESSORS) 50 MG/ML IJ SOLN
INTRAMUSCULAR | Status: DC | PRN
  Administered 2022-03-30: 10 mg via INTRAVENOUS

## 2022-03-30 MED ORDER — ONDANSETRON HCL 4 MG/2ML IJ SOLN
INTRAMUSCULAR | Status: DC | PRN
  Administered 2022-03-30: 4 mg via INTRAVENOUS

## 2022-03-30 MED ORDER — ENOXAPARIN SODIUM 40 MG/0.4ML IJ SOSY: 40.0000 mg | PREFILLED_SYRINGE | INTRAMUSCULAR | 0 refills | Status: AC

## 2022-03-30 MED ORDER — HYDROCODONE-ACETAMINOPHEN 7.5-325 MG PO TABS
1.0000 | ORAL_TABLET | ORAL | Status: DC | PRN
  Administered 2022-03-31 – 2022-04-03 (×4): 1 via ORAL
  Filled 2022-03-30 (×4): qty 1

## 2022-03-30 MED ORDER — CHLORHEXIDINE GLUCONATE 0.12 % MT SOLN
OROMUCOSAL | Status: AC
  Administered 2022-03-30: 15 mL via OROMUCOSAL
  Filled 2022-03-30: qty 15

## 2022-03-30 MED ORDER — TRAMADOL HCL 50 MG PO TABS
50.0000 mg | ORAL_TABLET | Freq: Four times a day (QID) | ORAL | Status: DC
  Administered 2022-03-30 – 2022-04-04 (×17): 50 mg via ORAL
  Filled 2022-03-30 (×17): qty 1

## 2022-03-30 MED ORDER — PROPOFOL 500 MG/50ML IV EMUL
INTRAVENOUS | Status: DC | PRN
  Administered 2022-03-30: 200 ug/kg/min via INTRAVENOUS

## 2022-03-30 MED ORDER — CEFAZOLIN SODIUM-DEXTROSE 2-4 GM/100ML-% IV SOLN
INTRAVENOUS | Status: AC
  Filled 2022-03-30: qty 100

## 2022-03-30 MED ORDER — LACTATED RINGERS IV SOLN: INTRAVENOUS | Status: DC

## 2022-03-30 MED ORDER — PHENYLEPHRINE HCL-NACL 20-0.9 MG/250ML-% IV SOLN
INTRAVENOUS | Status: DC | PRN
  Administered 2022-03-30: 60 ug/min via INTRAVENOUS

## 2022-03-30 MED ORDER — FLUTICASONE PROPIONATE 50 MCG/ACT NA SUSP
2.0000 | Freq: Every day | NASAL | Status: DC
Start: 2022-03-30 — End: 2022-04-04
  Administered 2022-03-30 – 2022-04-03 (×3): 2 via NASAL
  Filled 2022-03-30: qty 16

## 2022-03-30 MED ORDER — FLUOXETINE HCL 20 MG PO CAPS
40.0000 mg | ORAL_CAPSULE | Freq: Every day | ORAL | Status: DC
  Administered 2022-03-31 – 2022-04-04 (×5): 40 mg via ORAL
  Filled 2022-03-30 (×5): qty 2

## 2022-03-30 SURGICAL SUPPLY — 56 items
APL PRP STRL LF DISP 70% ISPRP (MISCELLANEOUS) ×1
BLADE SAGITTAL AGGR TOOTH XLG (BLADE) ×2 IMPLANT
BNDG CMPR 5X6 CHSV STRCH STRL (GAUZE/BANDAGES/DRESSINGS) ×2
BNDG COHESIVE 6X5 TAN ST LF (GAUZE/BANDAGES/DRESSINGS) ×5 IMPLANT
CANISTER WOUND CARE 500ML ATS (WOUND CARE) ×2 IMPLANT
CHLORAPREP W/TINT 26 (MISCELLANEOUS) ×2 IMPLANT
COVER BACK TABLE REUSABLE LG (DRAPES) ×2 IMPLANT
DRAPE 3/4 80X56 (DRAPES) ×5 IMPLANT
DRAPE C-ARM XRAY 36X54 (DRAPES) ×2 IMPLANT
DRAPE INCISE IOBAN 66X60 STRL (DRAPES) IMPLANT
DRAPE POUCH INSTRU U-SHP 10X18 (DRAPES) ×2 IMPLANT
DRESSING SURGICEL FIBRLLR 1X2 (HEMOSTASIS) ×2 IMPLANT
DRSG MEPILEX SACRM 8.7X9.8 (GAUZE/BANDAGES/DRESSINGS) ×2 IMPLANT
DRSG SURGICEL FIBRILLAR 1X2 (HEMOSTASIS) ×4
ELECT BLADE 6.5 EXT (BLADE) ×2 IMPLANT
ELECT REM PT RETURN 9FT ADLT (ELECTROSURGICAL) ×2
ELECTRODE REM PT RTRN 9FT ADLT (ELECTROSURGICAL) ×1 IMPLANT
GLOVE BIOGEL PI IND STRL 9 (GLOVE) ×1 IMPLANT
GLOVE BIOGEL PI INDICATOR 9 (GLOVE) ×1
GLOVE SURG SYN 9.0  PF PI (GLOVE) ×4
GLOVE SURG SYN 9.0 PF PI (GLOVE) ×2 IMPLANT
GOWN SRG 2XL LVL 4 RGLN SLV (GOWNS) ×1 IMPLANT
GOWN STRL NON-REIN 2XL LVL4 (GOWNS) ×2
GOWN STRL REUS W/ TWL LRG LVL3 (GOWN DISPOSABLE) ×1 IMPLANT
GOWN STRL REUS W/TWL LRG LVL3 (GOWN DISPOSABLE) ×2
HEAD FEMORAL 28MM SZ S (Head) ×1 IMPLANT
HIP DBL LINER 54X28 (Liner) ×1 IMPLANT
HOLDER FOLEY CATH W/STRAP (MISCELLANEOUS) ×2 IMPLANT
HOOD PEEL AWAY FLYTE STAYCOOL (MISCELLANEOUS) ×2 IMPLANT
KIT PREVENA INCISION MGT 13 (CANNISTER) ×2 IMPLANT
MANIFOLD NEPTUNE II (INSTRUMENTS) ×2 IMPLANT
MASTERLOC HIP LATERAL S6 (Hips) ×1 IMPLANT
MAT ABSORB  FLUID 56X50 GRAY (MISCELLANEOUS) ×2
MAT ABSORB FLUID 56X50 GRAY (MISCELLANEOUS) ×1 IMPLANT
NDL SPNL 20GX3.5 QUINCKE YW (NEEDLE) ×2 IMPLANT
NEEDLE SPNL 20GX3.5 QUINCKE YW (NEEDLE) ×4 IMPLANT
NS IRRIG 1000ML POUR BTL (IV SOLUTION) ×2 IMPLANT
PACK HIP COMPR (MISCELLANEOUS) ×2 IMPLANT
SCALPEL PROTECTED #10 DISP (BLADE) ×4 IMPLANT
SHELL ACETABULAR SZ 54 DM (Shell) ×1 IMPLANT
SOLUTION IRRIG SURGIPHOR (IV SOLUTION) ×2 IMPLANT
STAPLER SKIN PROX 35W (STAPLE) ×2 IMPLANT
STRAP SAFETY 5IN WIDE (MISCELLANEOUS) ×2 IMPLANT
SUT DVC 2 QUILL PDO  T11 36X36 (SUTURE) ×2
SUT DVC 2 QUILL PDO T11 36X36 (SUTURE) ×1 IMPLANT
SUT SILK 0 (SUTURE) ×2
SUT SILK 0 30XBRD TIE 6 (SUTURE) ×1 IMPLANT
SUT V-LOC 90 ABS DVC 3-0 CL (SUTURE) ×2 IMPLANT
SUT VIC AB 1 CT1 36 (SUTURE) ×2 IMPLANT
SYR 50ML LL SCALE MARK (SYRINGE) ×4 IMPLANT
SYR BULB IRRIG 60ML STRL (SYRINGE) ×2 IMPLANT
TAPE MICROFOAM 4IN (TAPE) IMPLANT
TOWEL OR 17X26 4PK STRL BLUE (TOWEL DISPOSABLE) IMPLANT
TRAY FOLEY MTR SLVR 16FR STAT (SET/KITS/TRAYS/PACK) ×2 IMPLANT
WAND WEREWOLF FASTSEAL 6.0 (MISCELLANEOUS) ×1 IMPLANT
WATER STERILE IRR 1000ML POUR (IV SOLUTION) ×2 IMPLANT

## 2022-03-30 NOTE — Discharge Instructions (Signed)

## 2022-03-30 NOTE — H&P (Signed)
Chief Complaint  Patient presents with  Right Hip - Pain  Right Knee - Pain    History of the Present Illness: Marie Foley is a 66 y.o. female here today.   The patient presents for evaluation of severe right hip pain. She received an injection on 11/30/2021 by Dr. Dorthula Nettles. X-rays on 11/30/2021 showed severe osteoarthritis, extensive cysts within the femoral head, complete loss of joint space circumferentially, and large osteophytes around the femoral head, anterior, superior, medial, and lateral. She also has right knee pain. She received injections in the right knee as well. Right knee x-rays show significant degenerative changes, particularly the patellofemoral, complete loss of lateral facet articular cartilage, loose ossicle to the lateral aspect of the patella, and lateral riding patella as well as moderate medial and lateral tibial compartment osteoarthritis. She comes in today to discuss treatment options.  She has to go up 2 flights of stairs 6 times a day. She has not tried to walk through the store for an hour. She is completely fatigued when she arrives home after completing her work. She fell and fractured her foot and twisted her right hip 1.5 years ago. She has significant swelling in her legs, and her primary care physician put her on diuretics for a couple of days, and the pain has resolved. She has never had blood clots.  She has not had laparoscopic or gallbladder surgery. She underwent a hysterectomy.  The patient is employed at [employer]. She plans on retiring this coming year.  I have reviewed past medical, surgical, social and family history, and allergies as documented in the EMR.  Past Medical History: Past Medical History:  Diagnosis Date  Anxiety disorder 04/26/2015   Past Surgical History: Past Surgical History:  Procedure Laterality Date  COLONOSCOPY 12/14/2006  Normal Colon: CBF 11/2016; Recall Ltr mailed 11/06/2016 (dw)  EGD  04/10/2008, 08/03/2006, 05/06/2005  No repeat per RTE   Past Family History: Family History  Problem Relation Age of Onset  Alzheimer's disease Father   Medications: Current Outpatient Medications Ordered in Epic  Medication Sig Dispense Refill  ALPRAZolam (XANAX) 0.5 MG tablet Take 1 tablet (0.5 mg total) by mouth 2 (two) times daily as needed for Sleep 180 tablet 1  FLUoxetine (PROZAC) 40 MG capsule Take 1 capsule (40 mg total) by mouth once daily 90 capsule 3  fluticasone propionate (FLONASE) 50 mcg/actuation nasal spray Place 1 spray into both nostrils 2 (two) times daily 16 g 0  FUROsemide (LASIX) 20 MG tablet TAKE 1 TABLET BY MOUTH ONCE DAILY FOR 30 DAYS. 30 tablet 0  omeprazole (PRILOSEC) 40 MG DR capsule Take 1 capsule (40 mg total) by mouth once daily 30 capsule 11   No current Epic-ordered facility-administered medications on file.   Allergies: Allergies  Allergen Reactions  Sulfamethoxazole-Trimethoprim Nausea And Vomiting  Nitrofurantoin Macrocrystal Nausea And Vomiting    Body mass index is 38.47 kg/m.  Review of Systems: A comprehensive 14 point ROS was performed, reviewed, and the pertinent orthopaedic findings are documented in the HPI.  There were no vitals filed for this visit.   General Physical Examination:   General/Constitutional: No apparent distress: well-nourished and well developed. Eyes: Pupils equal, round with synchronous movement. Lungs: Clear to auscultation HEENT: Normal Vascular: No edema, swelling or tenderness, except as noted in detailed exam. Cardiac: Heart rate and rhythm is regular. Integumentary: No impressive skin lesions present, except as noted in detailed exam. Neuro/Psych: Normal mood and affect, oriented to person, place  and time.  On exam, left hip internal rotation to 60 degrees, external rotation to 40 degrees without pain. Right hip internal rotation to 30 degrees with mild pain, external rotation to 30 degrees without  pain. Right knee crepitation. No flexion contracture.  Radiographs:  No new imaging studies were obtained today.  Assessment: ICD-10-CM  1. Primary osteoarthritis of right hip M16.11  2. Primary osteoarthritis of right knee M17.11   Plan:  The patient has clinical findings of severe right hip osteoarthritis and advanced right knee arthritis.  We discussed the patient's prior x-ray findings. I explained she has severe right hip osteoarthritis and advanced right knee arthritis. I recommended right hip arthroplasty. I explained the surgery and postoperative course in detail. We had an extensive discussion of joint arthroplasty surgery. She is expected to be out of work for 10 weeks. Hopefully she can recover from the hip arthritis and does not require knee arthroplasty for some time in the future.  We will schedule the patient for right hip arthroplasty in the near future.  Surgical Risks:  The nature of the condition and the proposed procedure has been reviewed in detail with the patient. Surgical versus non-surgical options and prognosis for recovery have been reviewed and the inherent risks and benefits of each have been discussed including the risks of infection, bleeding, injury to nerves/blood vessels/tendons, incomplete relief of symptoms, persisting pain and/or stiffness, loss of function, complex regional pain syndrome, failure of the procedure, as appropriate.  Teeth: * * *  Document Attestation: I, T. Tamilselvi, have reviewed and updated documentation for [facility], MD, utilizing [employer] DAX.   Electronically signed by Marlena Clipper, MD at 03/06/2022 8:09 PM EDT   Reviewed  H+P. No changes noted.

## 2022-03-30 NOTE — Anesthesia Procedure Notes (Signed)
Spinal  Patient location during procedure: OR Start time: 03/30/2022 10:05 AM End time: 03/30/2022 10:10 AM Reason for block: surgical anesthesia Staffing Performed: resident/CRNA  Anesthesiologist: Piscitello, Precious Haws, MD Resident/CRNA: Nelda Marseille, CRNA Performed by: Nelda Marseille, CRNA Authorized by: Andria Frames, MD   Preanesthetic Checklist Completed: patient identified, IV checked, site marked, risks and benefits discussed, surgical consent, monitors and equipment checked, pre-op evaluation and timeout performed Spinal Block Patient position: sitting Prep: ChloraPrep Patient monitoring: heart rate, continuous pulse ox, blood pressure and cardiac monitor Approach: midline Location: L3-4 Injection technique: single-shot Needle Needle type: Whitacre and Introducer  Needle gauge: 25 G Needle length: 9 cm Assessment Sensory level: T10 Events: CSF return Additional Notes Sterile aseptic technique used throughout the procedure.  Negative paresthesia. Negative blood return. Positive free-flowing CSF. Expiration date of kit checked and confirmed. Patient tolerated procedure well, without complications.

## 2022-03-30 NOTE — Anesthesia Preprocedure Evaluation (Signed)
Anesthesia Evaluation  Patient identified by MRN, date of birth, ID band Patient awake    Reviewed: Allergy & Precautions, NPO status , Patient's Chart, lab work & pertinent test results  History of Anesthesia Complications Negative for: history of anesthetic complications  Airway Mallampati: III  TM Distance: <3 FB Neck ROM: full    Dental  (+) Chipped   Pulmonary neg shortness of breath, sleep apnea , former smoker,    Pulmonary exam normal        Cardiovascular Exercise Tolerance: Good (-) angina(-) Past MI and (-) DOE negative cardio ROS Normal cardiovascular exam     Neuro/Psych PSYCHIATRIC DISORDERS negative neurological ROS     GI/Hepatic negative GI ROS, Neg liver ROS, neg GERD  ,  Endo/Other  negative endocrine ROS  Renal/GU      Musculoskeletal   Abdominal   Peds  Hematology negative hematology ROS (+)   Anesthesia Other Findings Past Medical History: No date: Anxiety No date: Arthritis  Past Surgical History: 10/07/2002: ABDOMINAL HYSTERECTOMY     Comment:  Dr. Luella Cook; due to hemorrhaging 1980's: BREAST EXCISIONAL BIOPSY; Left     Comment:  EXCISIONAL BX - NEG  BMI    Body Mass Index: 38.69 kg/m      Reproductive/Obstetrics negative OB ROS                             Anesthesia Physical Anesthesia Plan  ASA: 3  Anesthesia Plan: Spinal   Post-op Pain Management:    Induction:   PONV Risk Score and Plan:   Airway Management Planned: Natural Airway and Nasal Cannula  Additional Equipment:   Intra-op Plan:   Post-operative Plan:   Informed Consent: I have reviewed the patients History and Physical, chart, labs and discussed the procedure including the risks, benefits and alternatives for the proposed anesthesia with the patient or authorized representative who has indicated his/her understanding and acceptance.     Dental Advisory Given  Plan  Discussed with: Anesthesiologist, CRNA and Surgeon  Anesthesia Plan Comments: (Patient reports no bleeding problems and no anticoagulant use.  Plan for spinal with backup GA  Patient consented for risks of anesthesia including but not limited to:  - adverse reactions to medications - damage to eyes, teeth, lips or other oral mucosa - nerve damage due to positioning  - risk of bleeding, infection and or nerve damage from spinal that could lead to paralysis - risk of headache or failed spinal - damage to teeth, lips or other oral mucosa - sore throat or hoarseness - damage to heart, brain, nerves, lungs, other parts of body or loss of life  Patient voiced understanding.)        Anesthesia Quick Evaluation

## 2022-03-30 NOTE — Progress Notes (Signed)
Vernal order received for one time dose Hydrocodone-acetaminophen 7.5-325 mg. Order placed.

## 2022-03-30 NOTE — Evaluation (Signed)
Physical Therapy Evaluation Patient Details Name: Marie Foley MRN: [id number] DOB: [birth year] Today's Date: 03/30/2022  History of Present Illness  Pt is a 66 yo F s/p R THA 03/30/22. PMH includes sleep apnea, anxiety, arthritis.  Clinical Impression  Pt was pleasant and motivated to participate during the session and put forth good effort throughout. Pt was able to complete bed mobility w/ CGA and min verbal cues sequencing of LEs. Pt able to complete sit to stand w/ CGA following cuing for hand placement. Pt was able to take steps forward and back, as well as, side step to recliner using RW w/ CGA following cuing for sequencing; 1 minor posterior LOB noted with pt able to self correct. RN given OK to trial room air as long as SpO2 remained >/= 93; pt sats maintained until end of session with SpO2 staying around 90-91 after rest; no adverse symptoms reported/observed and pt was placed back on 2L; RN notified. Pt will benefit from HHPT upon discharge to safely address deficits listed in patient problem list for decreased caregiver assistance and eventual return to PLOF.       Recommendations for follow up therapy are one component of a multi-disciplinary discharge planning process, led by the attending physician.  Recommendations may be updated based on patient status, additional functional criteria and insurance authorization.  Follow Up Recommendations Home health PT      Assistance Recommended at Discharge Intermittent Supervision/Assistance  Patient can return home with the following  A little help with walking and/or transfers;A little help with bathing/dressing/bathroom;Assistance with cooking/housework;Assist for transportation;Help with stairs or ramp for entrance    Equipment Recommendations Rolling walker (2 wheels);BSC/3in1  Recommendations for Other Services       Functional Status Assessment Patient has had a recent decline in their functional status and demonstrates the  ability to make significant improvements in function in a reasonable and predictable amount of time.     Precautions / Restrictions Precautions Precautions: Anterior Hip Precaution Booklet Issued: Yes (comment) Restrictions Weight Bearing Restrictions: Yes RLE Weight Bearing: Weight bearing as tolerated      Mobility  Bed Mobility Overal bed mobility: Needs Assistance Bed Mobility: Supine to Sit     Supine to sit: Min guard     General bed mobility comments: extra time and effort to complete; verbal cues for sequencing of LEs    Transfers Overall transfer level: Needs assistance Equipment used: Rolling walker (2 wheels) Transfers: Sit to/from Stand Sit to Stand: Min guard           General transfer comment: extra time and effort to complete; verbal cues for hand placement    Ambulation/Gait Ambulation/Gait assistance: Min guard Gait Distance (Feet): 5 Feet Assistive device: Rolling walker (2 wheels) Gait Pattern/deviations: Step-to pattern, Decreased step length - right, Decreased step length - left, Decreased stance time - right Gait velocity: decreased     General Gait Details: able to walk forwards/backwards and sidestep to recliner following cues for sequencing  Stairs            Wheelchair Mobility    Modified Rankin (Stroke Patients Only)       Balance Overall balance assessment: Needs assistance Sitting-balance support: Bilateral upper extremity supported, Feet supported Sitting balance-Leahy Scale: Good     Standing balance support: Bilateral upper extremity supported, During functional activity Standing balance-Leahy Scale: Fair  Pertinent Vitals/Pain Pain Assessment Pain Assessment: 0-10 Pain Score: 9  Pain Location: R hip Pain Descriptors / Indicators: Aching Pain Intervention(s): Monitored during session, Patient requesting pain meds-RN notified, Repositioned, Ice applied    Home  Living Family/patient expects to be discharged to:: Private residence Living Arrangements: Alone Available Help at Discharge: Friend(s);Available PRN/intermittently Type of Home: House Home Access: Stairs to enter Entrance Stairs-Rails: Left Entrance Stairs-Number of Steps: 4   Home Layout: One level Home Equipment: None      Prior Function Prior Level of Function : Independent/Modified Independent             Mobility Comments: ind community ambulator using no AD but limited distances due to pain; no hx of falls ADLs Comments: ind w/ ADLs     Hand Dominance        Extremity/Trunk Assessment   Upper Extremity Assessment Upper Extremity Assessment: Overall WFL for tasks assessed    Lower Extremity Assessment Lower Extremity Assessment: Generalized weakness       Communication   Communication: No difficulties  Cognition Arousal/Alertness: Awake/alert Behavior During Therapy: WFL for tasks assessed/performed Overall Cognitive Status: Within Functional Limits for tasks assessed                                          General Comments      Exercises Total Joint Exercises Ankle Circles/Pumps: Strengthening, Both, 10 reps Quad Sets: Strengthening, Both, 10 reps Gluteal Sets: Strengthening, Both, 10 reps Other Exercises Other Exercises: Pt education of anterior hip precautions Other Exercises: HEP education per handout   Assessment/Plan    PT Assessment Patient needs continued PT services  PT Problem List Decreased strength;Decreased mobility;Decreased activity tolerance;Decreased range of motion;Decreased balance;Decreased knowledge of use of DME       PT Treatment Interventions DME instruction;Therapeutic exercise;Balance training;Gait training;Stair training;Functional mobility training;Therapeutic activities;Patient/family education    PT Goals (Current goals can be found in the Care Plan section)  Acute Rehab PT Goals Patient Stated  Goal: pt would like to get back to walking and living normal independent life PT Goal Formulation: With patient Time For Goal Achievement: 04/12/22 Potential to Achieve Goals: Good    Frequency BID     Co-evaluation               AM-PAC PT "6 Clicks" Mobility  Outcome Measure Help needed turning from your back to your side while in a flat bed without using bedrails?: A Little Help needed moving from lying on your back to sitting on the side of a flat bed without using bedrails?: A Little Help needed moving to and from a bed to a chair (including a wheelchair)?: A Little Help needed standing up from a chair using your arms (e.g., wheelchair or bedside chair)?: A Little Help needed to walk in hospital room?: A Little Help needed climbing 3-5 steps with a railing? : A Little 6 Click Score: 18    End of Session Equipment Utilized During Treatment: Gait belt Activity Tolerance: Patient tolerated treatment well Patient left: in chair;with call bell/phone within reach;with chair alarm set Nurse Communication: Mobility status PT Visit Diagnosis: Other abnormalities of gait and mobility (R26.89);Difficulty in walking, not elsewhere classified (R26.2);Muscle weakness (generalized) (M62.81);Pain Pain - Right/Left: Right Pain - part of body: Hip    Time: 0865-7846 PT Time Calculation (min) (ACUTE ONLY): 41 min   Charges:  Marica Otter, SPT  03/30/2022, 4:56 PM

## 2022-03-30 NOTE — Progress Notes (Signed)
Patient spoke to Dr. Rosita Kea about her right arm hurting her.  He said that "it may be related to the position she was in in surgery".  He advised her "to talk to Phyyical Therapy about it."

## 2022-03-30 NOTE — Op Note (Signed)
03/30/2022  11:37 AM  PATIENT:  Marie Foley  66 y.o. female  PRE-OPERATIVE DIAGNOSIS:  Primary osteoarthritis of right hip  M16.11  POST-OPERATIVE DIAGNOSIS:  Primary osteoarthritis of right hip  M16.11  PROCEDURE:  Procedure(s): TOTAL HIP ARTHROPLASTY ANTERIOR APPROACH (Right)  SURGEON: Leitha Schuller, MD  ASSISTANTS: none  ANESTHESIA:   spinal  EBL:  Total I/O In: 600 [I.V.:500; IV Piggyback:100] Out: 650 [Urine:450; Blood:200]  BLOOD ADMINISTERED:none  DRAINS:  Incisional wound VAC    LOCAL MEDICATIONS USED:  MARCAINE    and OTHER Exparel  SPECIMEN:  Source of Specimen:    Right femoral head  DISPOSITION OF SPECIMEN:  PATHOLOGY  COUNTS:  YES  TOURNIQUET:  * No tourniquets in log *  IMPLANTS: Medacta Master lock 6 LAT stem with 54 mm impact DM cup and liner with ceramic 28 mm head size S  DICTATION: .Dragon Dictation   The patient was brought to the operating room and after spinal anesthesia was obtained patient was placed on the operative table with the ipsilateral foot into the Medacta attachment, contralateral leg on a well-padded table. C-arm was brought in and preop template x-ray taken. After prepping and draping in usual sterile fashion appropriate patient identification and timeout procedures were completed. Anterior approach to the hip was obtained and centered over the greater trochanter and TFL muscle. The subcutaneous tissue was incised hemostasis being achieved by electrocautery. TFL fascia was incised and the muscle retracted laterally deep retractor placed. The lateral femoral circumflex vessels were identified and ligated. The anterior capsule was exposed and a capsulotomy performed. The neck was identified and a femoral neck cut carried out with a saw. The head was removed without difficulty and showed sclerotic femoral head and acetabulum. Reaming was carried out to 54 mm and a 54 mm cup trial gave appropriate tightness to the acetabular component a 54  DM cup was impacted into position. The leg was then externally rotated and ischiofemoral and pubofemoral releases carried out. The femur was sequentially broached to a size 6, size 6 LAT stem with S head trials were placed and the final components chosen. The 6 lap stem was inserted along with a ceramic S 28 mm head and 54 mm liner. The hip was reduced and was stable the wound was thoroughly irrigated with fibrillar placed along the posterior capsule and medial neck. The deep fascia ws closed using a heavy Quill after infiltration of 30 cc of quarter percent Sensorcaine with epinephrine diluted with Exparel throughout the case.  Marland Kitchen3-0 V-loc to close the skin with skin staples.  Incisional wound VAC applied and patient was sent to recovery in stable condition.   PLAN OF CARE: Admit for overnight observation

## 2022-03-30 NOTE — Transfer of Care (Signed)
Immediate Anesthesia Transfer of Care Note  Patient: Marie Foley  Procedure(s) Performed: TOTAL HIP ARTHROPLASTY ANTERIOR APPROACH (Right: Hip)  Patient Location: PACU  Anesthesia Type:Spinal  Level of Consciousness: awake, alert  and oriented  Airway & Oxygen Therapy: Patient Spontanous Breathing and Patient connected to face mask oxygen  Post-op Assessment: Report given to RN and Post -op Vital signs reviewed and stable  Post vital signs: Reviewed and stable  Last Vitals:  Vitals Value Taken Time  BP    Temp    Pulse 102 03/30/22 1140  Resp 14 03/30/22 1140  SpO2 92 % 03/30/22 1140  Vitals shown include unvalidated device data.  Last Pain:  Vitals:   03/30/22 0833  TempSrc: Oral  PainSc: 8          Complications: No notable events documented.

## 2022-03-30 NOTE — Anesthesia Procedure Notes (Signed)
Date/Time: 03/30/2022 10:10 AM  Performed by: Junious Silk, CRNAPre-anesthesia Checklist: Patient identified, Emergency Drugs available, Suction available, Patient being monitored and Timeout performed Oxygen Delivery Method: Simple face mask

## 2022-03-30 NOTE — Discharge Summary (Addendum)
Physician Discharge Summary  Patient ID: Marie Foley MRN: [id number] DOB/AGE: 1956/03/16 66 y.o.  Admit date: 03/30/2022 Discharge date: 04/04/2022  Admission Diagnoses:  Status post total hip replacement, right [Z96.641]   Discharge Diagnoses: Patient Active Problem List   Diagnosis Date Noted   Status post total hip replacement, right 03/30/2022   GAD (generalized anxiety disorder) 05/25/2021   Blood pressure elevated without history of HTN 05/25/2021   Elevated hemoglobin A1c 07/10/2018   Hypercholesterolemia 07/10/2018   Clinical depression 10/25/2015   Blood in the urine 10/25/2015   Avitaminosis D 10/25/2015   Calcification of left breast 07/27/2015   Anxiety disorder 04/26/2015    Past Medical History:  Diagnosis Date   Anxiety    Arthritis      Transfusion: none   Consultants (if any):   Discharged Condition: Improved  Hospital Course: Marie Foley is an 66 y.o. female who was admitted 03/30/2022 with a diagnosis of Status post total hip replacement, right and went to the operating room on 03/30/2022 and underwent the above named procedures.    Surgeries: Procedure(s): TOTAL HIP ARTHROPLASTY ANTERIOR APPROACH on 03/30/2022 Patient tolerated the surgery well. Taken to PACU where she was stabilized and then transferred to the orthopedic floor.  Started on Lovenox 40 mg q 24 hrs. Foot pumps applied bilaterally at 80 mm. Heels elevated on bed with rolled towels. No evidence of DVT. Negative Homan. Physical therapy started on day #1 for gait training and transfer. OT started day #1 for ADL and assisted devices.  Patient's foley was d/c on day #1. Patient's IV was d/c on day #1.  On post op day #5 patient was stable and ready for discharge to home with HHPT.    She was given perioperative antibiotics:  Anti-infectives (From admission, onward)    Start     Dose/Rate Route Frequency Ordered Stop   03/30/22 1600  ceFAZolin (ANCEF) IVPB 2g/100 mL premix         2 g 200 mL/hr over 30 Minutes Intravenous Every 6 hours 03/30/22 1346 03/30/22 2140   03/30/22 0837  ceFAZolin (ANCEF) 2-4 GM/100ML-% IVPB       Note to Pharmacy: Stefano Gaul H: cabinet override      03/30/22 0837 03/30/22 1024   03/30/22 0600  ceFAZolin (ANCEF) IVPB 2g/100 mL premix        2 g 200 mL/hr over 30 Minutes Intravenous On call to O.R. 03/30/22 0133 03/30/22 1025     .  She was given sequential compression devices, early ambulation, and Lovenox TEDs for DVT prophylaxis.  She benefited maximally from the hospital stay and there were no complications.    Recent vital signs:  Vitals:   04/04/22 0839 04/04/22 1202  BP: 127/86 121/76  Pulse: 81 88  Resp: 18 18  Temp: 98.1 F (36.7 C) 98.1 F (36.7 C)  SpO2: 95% (!) 89%    Recent laboratory studies:  Lab Results  Component Value Date   HGB 11.2 (L) 04/01/2022   HGB 11.1 (L) 03/31/2022   HGB 12.9 03/30/2022   Lab Results  Component Value Date   WBC 10.9 (H) 04/01/2022   PLT 331 04/01/2022   No results found for: "INR" Lab Results  Component Value Date   NA 133 (L) 03/31/2022   K 4.2 03/31/2022   CL 100 03/31/2022   CO2 27 03/31/2022   BUN 10 03/31/2022   CREATININE 0.44 03/31/2022   GLUCOSE 124 (H) 03/31/2022    Discharge Medications:  Allergies as of 04/04/2022       Reactions   Bactrim [sulfamethoxazole-trimethoprim] Nausea And Vomiting   Diclofenac-misoprostol    Macrobid [nitrofurantoin Macrocrystal] Nausea And Vomiting        Medication List     TAKE these medications    ALPRAZolam 0.5 MG tablet Commonly known as: XANAX Take 0.5 mg by mouth at bedtime as needed for anxiety.   docusate sodium 100 MG capsule Commonly known as: COLACE Take 1 capsule (100 mg total) by mouth 2 (two) times daily.   enoxaparin 40 MG/0.4ML injection Commonly known as: LOVENOX Inject 0.4 mLs (40 mg total) into the skin daily for 14 days.   FLUoxetine 40 MG capsule Commonly known as:  PROZAC Take 1 capsule (40 mg total) by mouth daily.   fluticasone 50 MCG/ACT nasal spray Commonly known as: FLONASE PLACE 1 SPRAY INTO BOTH NOSTRILS 2 (TWO) TIMES DAILY.   HYDROcodone-acetaminophen 7.5-325 MG tablet Commonly known as: NORCO Take 1-2 tablets by mouth every 4 (four) hours as needed for severe pain (pain score 7-10).   hydrOXYzine 10 MG tablet Commonly known as: ATARAX Take 1 tablet (10 mg total) by mouth 3 (three) times daily as needed.   methocarbamol 500 MG tablet Commonly known as: ROBAXIN Take 1 tablet (500 mg total) by mouth every 6 (six) hours as needed for muscle spasms.   omeprazole 40 MG capsule Commonly known as: PRILOSEC Take 40 mg by mouth daily.   traMADol 50 MG tablet Commonly known as: ULTRAM Take 1 tablet (50 mg total) by mouth every 6 (six) hours as needed. What changed:  when to take this reasons to take this               Durable Medical Equipment  (From admission, onward)           Start     Ordered   03/30/22 1716  For home use only DME Walker rolling  Once       Question Answer Comment  Walker: With 5 Inch Wheels   Patient needs a walker to treat with the following condition Difficulty walking      03/30/22 1715   03/30/22 1716  For home use only DME 3 n 1  Once        03/30/22 1715   03/30/22 1347  DME Walker rolling  Once       Question Answer Comment  Walker: With 5 Inch Wheels   Patient needs a walker to treat with the following condition Status post total hip replacement, right      03/30/22 1346   03/30/22 1347  DME 3 n 1  Once        03/30/22 1346   03/30/22 1347  DME Bedside commode  Once       Question:  Patient needs a bedside commode to treat with the following condition  Answer:  Status post total hip replacement, right   03/30/22 1346            Diagnostic Studies: DG HIP UNILAT W OR W/O PELVIS 2-3 VIEWS RIGHT  Result Date: 03/30/2022 CLINICAL DATA:  Right hip replacement EXAM: DG HIP (WITH OR  WITHOUT PELVIS) 2-3V RIGHT COMPARISON:  None Available. FINDINGS: Postsurgical changes of right hip arthroplasty. Normal alignment thought evidence of loosening or fracture. Expected soft tissue changes. IMPRESSION: Postsurgical changes of right hip arthroplasty. Normal alignment. No evidence of immediate hardware complication. Electronically Signed   By: Caprice Renshaw M.D.   On:  03/30/2022 12:23   DG HIP UNILAT WITH PELVIS 1V RIGHT  Result Date: 03/30/2022 CLINICAL DATA:  Right hip replacement. EXAM: DG HIP (WITH OR WITHOUT PELVIS) 1V RIGHT COMPARISON:  None Available. FLUOROSCOPY TIME:  Radiation Exposure Index (as provided by the fluoroscopic device): 1.63 mGy Kerma C-arm fluoroscopic images were obtained intraoperatively and submitted for post operative interpretation. FINDINGS: Multiple intraoperative fluoroscopic images demonstrate interval right total hip arthroplasty. Components are well aligned. No acute osseous abnormality. IMPRESSION: 1. Intraoperative fluoroscopic guidance for right total hip arthroplasty. Electronically Signed   By: Obie Dredge M.D.   On: 03/30/2022 11:50   DG C-Arm 1-60 Min-No Report  Result Date: 03/30/2022 Fluoroscopy was utilized by the requesting physician.  No radiographic interpretation.    Disposition: Discharge disposition: 01-Home or Self Care     home with home health PT     Contact information for follow-up providers     Evon Slack, PA-C Follow up in 10 day(s).   Specialties: Orthopedic Surgery, Emergency Medicine Contact information: [address] [place] [address] [phone]         [facility] Follow up.   Why: They will follow up with you for your home health needs.             Contact information for after-discharge care     Destination     [facility] Preferred SNF .   Service: Skilled Nursing Contact information: [address] [place]  [address] [phone]                      Signed: Madelyn Flavors 04/04/2022, 12:23 PM

## 2022-03-31 DIAGNOSIS — M1611 Unilateral primary osteoarthritis, right hip: Secondary | ICD-10-CM | POA: Diagnosis not present

## 2022-03-31 LAB — CBC
HCT: 34.4 % — ABNORMAL LOW (ref 36.0–46.0)
Hemoglobin: 11.1 g/dL — ABNORMAL LOW (ref 12.0–15.0)
MCH: 30.8 pg (ref 26.0–34.0)
MCHC: 32.3 g/dL (ref 30.0–36.0)
MCV: 95.6 fL (ref 80.0–100.0)
Platelets: 311 10*3/uL (ref 150–400)
RBC: 3.6 MIL/uL — ABNORMAL LOW (ref 3.87–5.11)
RDW: 12.6 % (ref 11.5–15.5)
WBC: 9.4 10*3/uL (ref 4.0–10.5)
nRBC: 0 % (ref 0.0–0.2)

## 2022-03-31 LAB — BASIC METABOLIC PANEL
Anion gap: 6 (ref 5–15)
BUN: 10 mg/dL (ref 8–23)
CO2: 27 mmol/L (ref 22–32)
Calcium: 8.6 mg/dL — ABNORMAL LOW (ref 8.9–10.3)
Chloride: 100 mmol/L (ref 98–111)
Creatinine, Ser: 0.44 mg/dL (ref 0.44–1.00)
GFR, Estimated: >60 mL/min (ref >60)
Glucose, Bld: 124 mg/dL — ABNORMAL HIGH (ref 70–99)
Potassium: 4.2 mmol/L (ref 3.5–5.1)
Sodium: 133 mmol/L — ABNORMAL LOW (ref 135–145)

## 2022-03-31 NOTE — Progress Notes (Signed)
   Subjective: 1 Day Post-Op Procedure(s) (LRB): TOTAL HIP ARTHROPLASTY ANTERIOR APPROACH (Right) Patient reports pain as mild and moderate.   Patient is well, and has had no acute complaints or problems Denies any CP, SOB, ABD pain. We will continue therapy today.  Plan is to go Home after hospital stay.  Objective: Vital signs in last 24 hours: Temp:  [96.3 F (35.7 C)-97.7 F (36.5 C)] 97.4 F (36.3 C) (06/30 0609) Pulse Rate:  [76-104] 98 (06/30 0609) Resp:  [9-26] 19 (06/30 0609) BP: (81-148)/(55-97) 131/72 (06/30 0609) SpO2:  [82 %-98 %] 96 % (06/30 0609)  Intake/Output from previous day: 06/29 0701 - 06/30 0700 In: 3784.3 [P.O.:680; I.V.:2804.3; IV Piggyback:300] Out: 2150 [Urine:1950; Blood:200] Intake/Output this shift: No intake/output data recorded.  Recent Labs    03/30/22 1359 03/31/22 0510  HGB 12.9 11.1*   Recent Labs    03/30/22 1359 03/31/22 0510  WBC 15.5* 9.4  RBC 4.13 3.60*  HCT 40.5 34.4*  PLT 390 311   Recent Labs    03/30/22 1359 03/31/22 0510  NA  --  133*  K  --  4.2  CL  --  100  CO2  --  27  BUN  --  10  CREATININE 0.54 0.44  GLUCOSE  --  124*  CALCIUM  --  8.6*   No results for input(s): "LABPT", "INR" in the last 72 hours.  EXAM General - Patient is Alert, Appropriate, and Oriented Extremity - Neurovascular intact Sensation intact distally Intact pulses distally Dorsiflexion/Plantar flexion intact Dressing - dressing C/D/I and no drainage Motor Function - intact, moving foot and toes well on exam.   Past Medical History:  Diagnosis Date   Anxiety    Arthritis     Assessment/Plan:   1 Day Post-Op Procedure(s) (LRB): TOTAL HIP ARTHROPLASTY ANTERIOR APPROACH (Right) Principal Problem:   Status post total hip replacement, right  Estimated body mass index is 38.69 kg/m as calculated from the following:   Height as of this encounter: 5\' 7"  (1.702 m).   Weight as of 03/22/22: 112 kg. Advance diet Up with  therapy Labs and VSS Pain well controlled CM to assist with discharge to home with HHPT  DVT Prophylaxis - Lovenox, TED hose, and SCDs Weight-Bearing as tolerated to right leg   T. 03/24/22, PA-C [facility] 03/31/2022, 7:25 AM

## 2022-03-31 NOTE — Anesthesia Postprocedure Evaluation (Signed)
Anesthesia Post Note  Patient: Marie Foley  Procedure(s) Performed: TOTAL HIP ARTHROPLASTY ANTERIOR APPROACH (Right: Hip)  Patient location during evaluation: Nursing Unit Anesthesia Type: Spinal Level of consciousness: awake, awake and alert and oriented Pain management: pain level controlled Vital Signs Assessment: post-procedure vital signs reviewed and stable Respiratory status: respiratory function stable, spontaneous breathing and nonlabored ventilation Cardiovascular status: blood pressure returned to baseline Postop Assessment: no headache and no backache Anesthetic complications: no   No notable events documented.   Last Vitals:  Vitals:   03/30/22 2001 03/31/22 0609  BP: (!) 144/90 131/72  Pulse: 92 98  Resp: 19 19  Temp: 36.4 C (!) 36.3 C  SpO2: 95% 96%    Last Pain:  Vitals:   03/31/22 0609  TempSrc:   PainSc: 0-No pain                 [name]

## 2022-03-31 NOTE — NC FL2 (Signed)
[place] MEDICAID FL2 LEVEL OF CARE SCREENING TOOL     IDENTIFICATION  Patient Name: Marie Foley Birthdate: 15-May-1956 Sex: female Admission Date (Current Location): 03/30/2022  [facility] and [place] Number:  Chiropodist and Address:  [facility], [address], [place], [place] [address]      Provider Number: [id number]  Attending Physician Name and Address:  Kennedy Bucker, MD  Relative Name and Phone Number:  Vanita Panda [phone]    Current Level of Care: Hospital Recommended Level of Care: Skilled Nursing Facility Prior Approval Number:    Date Approved/Denied:   PASRR Number: [id number]  Discharge Plan: SNF    Current Diagnoses: Patient Active Problem List   Diagnosis Date Noted   Status post total hip replacement, right 03/30/2022   GAD (generalized anxiety disorder) 05/25/2021   Blood pressure elevated without history of HTN 05/25/2021   Elevated hemoglobin A1c 07/10/2018   Hypercholesterolemia 07/10/2018   Clinical depression 10/25/2015   Blood in the urine 10/25/2015   Avitaminosis D 10/25/2015   Calcification of left breast 07/27/2015   Anxiety disorder 04/26/2015    Orientation RESPIRATION BLADDER Height & Weight     Self, Time, Situation, Place  Normal Continent, External catheter Weight:   Height:  5\' 7"  (170.2 cm)  BEHAVIORAL SYMPTOMS/MOOD NEUROLOGICAL BOWEL NUTRITION STATUS      Continent Diet (see DC summary)  AMBULATORY STATUS COMMUNICATION OF NEEDS Skin   Extensive Assist Verbally Normal, Surgical wounds                       Personal Care Assistance Level of Assistance  Bathing, Feeding, Dressing Bathing Assistance: Limited assistance Feeding assistance: Independent Dressing Assistance: Limited assistance     Functional Limitations Info             SPECIAL CARE FACTORS FREQUENCY  PT (By licensed PT), OT (By licensed OT)     PT Frequency: 5 times per week OT Frequency: 5 times per  week            Contractures Contractures Info: Not present    Additional Factors Info  Code Status, Allergies Code Status Info: full code Allergies Info: Bactrim (Sulfamethoxazole-trimethoprim), Diclofenac-misoprostol, Macrobid           Current Medications (03/31/2022):  This is the current hospital active medication list Current Facility-Administered Medications  Medication Dose Route Frequency Provider Last Rate Last Admin   0.9 %  sodium chloride infusion   Intravenous Continuous 04/02/2022, MD 100 mL/hr at 03/31/22 0205 New Bag at 03/31/22 0205   acetaminophen (TYLENOL) tablet 325-650 mg  325-650 mg Oral Q6H PRN 04/02/22, MD       ALPRAZolam Kennedy Bucker) tablet 0.5 mg  0.5 mg Oral QHS PRN Prudy Feeler, MD   0.5 mg at 03/30/22 2108   alum & mag hydroxide-simeth (MAALOX/MYLANTA) 200-200-20 MG/5ML suspension 30 mL  30 mL Oral Q4H PRN 04-10-2001, MD       bisacodyl (DULCOLAX) EC tablet 5 mg  5 mg Oral Daily PRN Kennedy Bucker, MD   5 mg at 03/30/22 1757   diphenhydrAMINE (BENADRYL) 12.5 MG/5ML elixir 12.5-25 mg  12.5-25 mg Oral Q4H PRN 11-01-1984, MD       docusate sodium (COLACE) capsule 100 mg  100 mg Oral BID Kennedy Bucker, MD   100 mg at 03/31/22 1024   enoxaparin (LOVENOX) injection 40 mg  40 mg Subcutaneous Q24H 04/02/22, MD   40 mg  at 03/31/22 0800   FLUoxetine (PROZAC) capsule 40 mg  40 mg Oral Daily Kennedy Bucker, MD   40 mg at 03/31/22 1024   fluticasone (FLONASE) 50 MCG/ACT nasal spray 2 spray  2 spray Each Nare Daily Kennedy Bucker, MD   2 spray at 03/31/22 1028   HYDROcodone-acetaminophen (NORCO) 7.5-325 MG per tablet 1-2 tablet  1-2 tablet Oral Q4H PRN Kennedy Bucker, MD   1 tablet at 03/31/22 1015   HYDROcodone-acetaminophen (NORCO/VICODIN) 5-325 MG per tablet 1-2 tablet  1-2 tablet Oral Q4H PRN Kennedy Bucker, MD   2 tablet at 03/30/22 1649   menthol-cetylpyridinium (CEPACOL) lozenge 3 mg  1 lozenge Oral PRN Kennedy Bucker, MD       Or   phenol  (CHLORASEPTIC) mouth spray 1 spray  1 spray Mouth/Throat PRN Kennedy Bucker, MD       methocarbamol (ROBAXIN) tablet 500 mg  500 mg Oral Q6H PRN Kennedy Bucker, MD   500 mg at 03/30/22 1757   Or   methocarbamol (ROBAXIN) 500 mg in dextrose 5 % 50 mL IVPB  500 mg Intravenous Q6H PRN Kennedy Bucker, MD       metoCLOPramide (REGLAN) tablet 5-10 mg  5-10 mg Oral Q8H PRN Kennedy Bucker, MD   10 mg at 03/30/22 1808   Or   metoCLOPramide (REGLAN) injection 5-10 mg  5-10 mg Intravenous Q8H PRN Kennedy Bucker, MD       morphine (PF) 2 MG/ML injection 0.5-1 mg  0.5-1 mg Intravenous Q2H PRN Kennedy Bucker, MD       ondansetron [facility]) tablet 4 mg  4 mg Oral Q6H PRN Kennedy Bucker, MD       Or   ondansetron [facility]) injection 4 mg  4 mg Intravenous Q6H PRN Kennedy Bucker, MD   4 mg at 03/30/22 1406   pantoprazole (PROTONIX) EC tablet 40 mg  40 mg Oral Daily Kennedy Bucker, MD   40 mg at 03/31/22 1024   senna-docusate (Senokot-S) tablet 1 tablet  1 tablet Oral QHS PRN Kennedy Bucker, MD       sodium phosphate (FLEET) 7-19 GM/118ML enema 1 enema  1 enema Rectal Once PRN Kennedy Bucker, MD       traMADol Janean Sark) tablet 50 mg  50 mg Oral Q6H Kennedy Bucker, MD   50 mg at 03/31/22 0800   zolpidem (AMBIEN) tablet 5 mg  5 mg Oral QHS PRN Kennedy Bucker, MD   5 mg at 03/31/22 0150     Discharge Medications: Please see discharge summary for a list of discharge medications.  Relevant Imaging Results:  Relevant Lab Results:   Additional Information SS# [id number]  Marlowe Sax, RN

## 2022-03-31 NOTE — TOC Progression Note (Signed)
Transition of Care [facility]) - Progression Note    Patient Details  Name: Marie Foley MRN: [id number] Date of Birth: [birth year]  [facility]) CM/SW Contact  Marlowe Sax, RN Phone Number: 03/31/2022, 10:40 AM  Clinical Narrative:   patient recommendation is to go to SNF, Patient is agreeable , Bedsearch completed, PASSR obtained, FL2 completed         Expected Discharge Plan and Services                                                 Social Determinants of Health (SDOH) Interventions    Readmission Risk Interventions     No data to display

## 2022-03-31 NOTE — Progress Notes (Signed)
Physical Therapy Treatment Patient Details Name: Marie Foley MRN: [id number] DOB: 1956-09-16 Today's Date: 03/31/2022   History of Present Illness Pt is a 66 yo F s/p R THA 03/30/22. PMH includes sleep apnea, anxiety, arthritis.    PT Comments    Pt was pleasant and motivated to participate during the session and put forth good effort throughout. Pt is able to complete supine to sit w/ CGA with extra time and effort to complete; minA for leg management when moving from sit to supine. Pt able to complete sit to stand w/ CGA but still very effortful even with bed slightly elevated. Pt ambulated 6ft using RW w/ CGA with very slow and effortful gait. Pt demonstrates very low activity tolerance even with pain medication. No other adverse symptoms reported/observed other than pain with HR and SpO2 WNL on room air. Pt will benefit from PT services in a SNF setting upon discharge to safely address deficits listed in patient problem list for decreased caregiver assistance and eventual return to PLOF.   Recommendations for follow up therapy are one component of a multi-disciplinary discharge planning process, led by the attending physician.  Recommendations may be updated based on patient status, additional functional criteria and insurance authorization.  Follow Up Recommendations  Skilled nursing-short term rehab (<3 hours/day) Can patient physically be transported by private vehicle: Yes   Assistance Recommended at Discharge Intermittent Supervision/Assistance  Patient can return home with the following A little help with walking and/or transfers;A little help with bathing/dressing/bathroom;Assistance with cooking/housework;Assist for transportation;Help with stairs or ramp for entrance   Equipment Recommendations  Rolling walker (2 wheels);BSC/3in1    Recommendations for Other Services       Precautions / Restrictions Precautions Precautions: Anterior Hip Precaution Booklet Issued: Yes  (comment) Restrictions Weight Bearing Restrictions: Yes RLE Weight Bearing: Weight bearing as tolerated     Mobility  Bed Mobility Overal bed mobility: Needs Assistance Bed Mobility: Supine to Sit, Sit to Supine     Supine to sit: Min guard Sit to supine: Min assist   General bed mobility comments: minA for leg management for sit <> supine    Transfers Overall transfer level: Needs assistance Equipment used: Rolling walker (2 wheels) Transfers: Sit to/from Stand Sit to Stand: Min guard           General transfer comment: extra time and effort to complete w/ bed slightly elevated    Ambulation/Gait Ambulation/Gait assistance: Min guard Gait Distance (Feet): 20 Feet Assistive device: Rolling walker (2 wheels) Gait Pattern/deviations: Step-to pattern, Decreased step length - right, Decreased step length - left, Decreased stance time - right Gait velocity: decreased     General Gait Details: very slow and effortful gait   Stairs             Wheelchair Mobility    Modified Rankin (Stroke Patients Only)       Balance Overall balance assessment: Needs assistance Sitting-balance support: Bilateral upper extremity supported, Feet supported Sitting balance-Leahy Scale: Good     Standing balance support: Bilateral upper extremity supported, During functional activity Standing balance-Leahy Scale: Fair                              Cognition Arousal/Alertness: Awake/alert Behavior During Therapy: WFL for tasks assessed/performed Overall Cognitive Status: Within Functional Limits for tasks assessed  Exercises Total Joint Exercises Ankle Circles/Pumps: Strengthening, Both, 10 reps Long Arc Quad: Strengthening, Both, 10 reps    General Comments        Pertinent Vitals/Pain Pain Assessment Pain Assessment: 0-10 Pain Score: 8  Pain Location: R hip Pain Descriptors / Indicators:  Aching, Grimacing, Discomfort Pain Intervention(s): Monitored during session, Premedicated before session, Repositioned, Ice applied    Home Living                          Prior Function            PT Goals (current goals can now be found in the care plan section) Progress towards PT goals: Progressing toward goals    Frequency    BID      PT Plan Current plan remains appropriate    Co-evaluation              AM-PAC PT "6 Clicks" Mobility   Outcome Measure  Help needed turning from your back to your side while in a flat bed without using bedrails?: A Little Help needed moving from lying on your back to sitting on the side of a flat bed without using bedrails?: A Little Help needed moving to and from a bed to a chair (including a wheelchair)?: A Little Help needed standing up from a chair using your arms (e.g., wheelchair or bedside chair)?: A Little Help needed to walk in hospital room?: A Little Help needed climbing 3-5 steps with a railing? : A Little 6 Click Score: 18    End of Session Equipment Utilized During Treatment: Gait belt Activity Tolerance: Patient tolerated treatment well Patient left: with bed alarm set;in bed;with call bell/phone within reach Nurse Communication: Mobility status PT Visit Diagnosis: Other abnormalities of gait and mobility (R26.89);Muscle weakness (generalized) (M62.81);Pain Pain - Right/Left: Right Pain - part of body: Hip     Time: 4270-6237 PT Time Calculation (min) (ACUTE ONLY): 30 min  Charges:                        Marica Otter, SPT  03/31/2022, 4:29 PM

## 2022-03-31 NOTE — Progress Notes (Signed)
Physical Therapy Treatment Patient Details Name: Marie Foley MRN: [id number] DOB: 1956-04-11 Today's Date: 03/31/2022   History of Present Illness Pt is a 66 yo F s/p R THA 03/30/22. PMH includes sleep apnea, anxiety, arthritis.    PT Comments    Pt was pleasant and motivated to participate during the session and put forth good effort throughout. Pt was very limited this session. Pt able to complete to sit to stand w/ CGA but needed bed to be slightly elevated and verbal cuing for hand placement. Pt was able to ambulate in room 34ft x1, 56ft x1 using RW w/ CGA but with very slow effortful gait and needed cuing for sequencing of step to pattern to help with pain management. Pt will benefit from PT services in a SNF setting upon discharge to safely address deficits listed in patient problem list for decreased caregiver assistance and eventual return to PLOF.    Recommendations for follow up therapy are one component of a multi-disciplinary discharge planning process, led by the attending physician.  Recommendations may be updated based on patient status, additional functional criteria and insurance authorization.  Follow Up Recommendations  Skilled nursing-short term rehab (<3 hours/day)     Assistance Recommended at Discharge Intermittent Supervision/Assistance  Patient can return home with the following A little help with walking and/or transfers;A little help with bathing/dressing/bathroom;Assistance with cooking/housework;Assist for transportation;Help with stairs or ramp for entrance   Equipment Recommendations  Rolling walker (2 wheels);BSC/3in1    Recommendations for Other Services       Precautions / Restrictions Precautions Precautions: Anterior Hip Precaution Booklet Issued: Yes (comment) Restrictions Weight Bearing Restrictions: Yes RLE Weight Bearing: Weight bearing as tolerated     Mobility  Bed Mobility               General bed mobility comments: Pt  received from CNA after complete toileting    Transfers Overall transfer level: Needs assistance Equipment used: Rolling walker (2 wheels) Transfers: Sit to/from Stand             General transfer comment: extra time and effort to complete; verbal cues for hand placement and bed slightly elevated    Ambulation/Gait   Gait Distance (Feet): 15 Feet x1; 10 Feet x1 Assistive device: Rolling walker (2 wheels) Gait Pattern/deviations: Step-to pattern, Decreased step length - right, Decreased step length - left, Decreased stance time - right Gait velocity: decreased     General Gait Details: verbal cuing for sequencing of step to pattern for pain management; slow effortful gait   Stairs             Wheelchair Mobility    Modified Rankin (Stroke Patients Only)       Balance Overall balance assessment: Needs assistance Sitting-balance support: Bilateral upper extremity supported, Feet supported Sitting balance-Leahy Scale: Good     Standing balance support: Bilateral upper extremity supported, During functional activity Standing balance-Leahy Scale: Fair                              Cognition Arousal/Alertness: Awake/alert Behavior During Therapy: WFL for tasks assessed/performed Overall Cognitive Status: Within Functional Limits for tasks assessed                                          Exercises Total Joint Exercises Ankle Circles/Pumps: Strengthening, Both,  10 reps Long Arc Quad: Strengthening, Both, 10 reps    General Comments        Pertinent Vitals/Pain Pain Assessment Pain Assessment: 0-10 Pain Score: 8  Pain Location: R hip Pain Descriptors / Indicators: Aching, Grimacing, Discomfort Pain Intervention(s): Limited activity within patient's tolerance, Monitored during session, Patient requesting pain meds-RN notified, Repositioned    Home Living                          Prior Function             PT Goals (current goals can now be found in the care plan section) Progress towards PT goals: Progressing toward goals    Frequency    BID      PT Plan Discharge plan needs to be updated    Co-evaluation              AM-PAC PT "6 Clicks" Mobility   Outcome Measure  Help needed turning from your back to your side while in a flat bed without using bedrails?: A Little Help needed moving from lying on your back to sitting on the side of a flat bed without using bedrails?: A Little Help needed moving to and from a bed to a chair (including a wheelchair)?: A Little Help needed standing up from a chair using your arms (e.g., wheelchair or bedside chair)?: A Little Help needed to walk in hospital room?: A Little Help needed climbing 3-5 steps with a railing? : A Little 6 Click Score: 18    End of Session Equipment Utilized During Treatment: Gait belt Activity Tolerance: Patient tolerated treatment well Patient left: in chair;with call bell/phone within reach;with chair alarm set;with nursing/sitter in room Nurse Communication: Mobility status PT Visit Diagnosis: Other abnormalities of gait and mobility (R26.89);Muscle weakness (generalized) (M62.81);Pain Pain - Right/Left: Right Pain - part of body: Hip     Time: 3491-7915 PT Time Calculation (min) (ACUTE ONLY): 30 min  Charges:                        Marica Otter, SPT  03/31/2022, 10:33 AM

## 2022-03-31 NOTE — Evaluation (Signed)
Occupational Therapy Evaluation Patient Details Name: Marie Foley MRN: [id number] DOB: July 19, 1956 Today's Date: 03/31/2022   History of Present Illness Pt is a 66 yo F s/p R THA 03/30/22. PMH includes sleep apnea, anxiety, arthritis.   Clinical Impression   Pt seen for OT evaluation this date, POD#1 from above surgery. Pt was independent in all ADL prior to surgery. Pt is eager to return to PLOF with less pain and improved safety and independence. Pt currently requires MAX A for LB dressing and bathing while in seated position due to pain and limited AROM of R hip. Pt completed sup>sit with supervision and increased effort due to pain. Pt appears to demonstrate some cognitive impairments including slow processing, decreased awareness and limited insight into how deficits impact her mobility and ADL safety/performance. Pt endorses she thought she would be doing so much better and expresses feeling overwhelmed. Active listening and emotional support provided. Pt unable to achieve standing from standard height bed with MAX A + VC for RW mgt due to pain and decreased strength. Pt educated in role of OT, process for therapy and discharge recommendations, instructed in home/routines modifications, assist needed for various aspects during recovery, AE/DME training, and ADL transfer training.  Pt would benefit from additional instruction in self care skills and techniques to help maintain precautions with or without assistive devices to support recall and carryover prior to discharge. Recommend SNF upon discharge.     Recommendations for follow up therapy are one component of a multi-disciplinary discharge planning process, led by the attending physician.  Recommendations may be updated based on patient status, additional functional criteria and insurance authorization.   Follow Up Recommendations  Skilled nursing-short term rehab (<3 hours/day)    Assistance Recommended at Discharge Frequent or constant  Supervision/Assistance  Patient can return home with the following A lot of help with walking and/or transfers;A lot of help with bathing/dressing/bathroom;Assistance with cooking/housework;Assist for transportation;Help with stairs or ramp for entrance;Direct supervision/assist for medications management;Direct supervision/assist for financial management    Functional Status Assessment  Patient has had a recent decline in their functional status and demonstrates the ability to make significant improvements in function in a reasonable and predictable amount of time.  Equipment Recommendations  BSC/3in1;Other (comment) (2WW)    Recommendations for Other Services       Precautions / Restrictions Precautions Precautions: Anterior Hip Precaution Booklet Issued: Yes (comment) Restrictions Weight Bearing Restrictions: Yes RLE Weight Bearing: Weight bearing as tolerated      Mobility Bed Mobility Overal bed mobility: Needs Assistance Bed Mobility: Supine to Sit, Sit to Supine     Supine to sit: Supervision, HOB elevated Sit to supine: Min assist   General bed mobility comments: increased time/effort, bed rail use, MIN A for RLE mgt back to bed    Transfers Overall transfer level: Needs assistance Equipment used: Rolling walker (2 wheels) Transfers: Sit to/from Stand, Bed to chair/wheelchair/BSC Sit to Stand: Max assist          Lateral/Scoot Transfers: Min assist General transfer comment: from standard height bed, pt required significant assist to attempt to lift buttocks off the bed, but ultimately unable to achieve. VC for hand placement and RW mgt. MIN A for lateral scoots EOB with VC for sequencing.      Balance Overall balance assessment: Needs assistance Sitting-balance support: Single extremity supported, No upper extremity supported, Feet supported Sitting balance-Leahy Scale: Fair         Standing balance comment: unable to achieve  standing from std height  bed                           ADL either performed or assessed with clinical judgement   ADL                                         General ADL Comments: Pt currently requires MAX A for LB ADL tasks, set up and supv for seated UB ADL. TOTAL A for compression stocking mgt.     Vision         Perception     Praxis      Pertinent Vitals/Pain Pain Assessment Pain Assessment: 0-10 Pain Score: 6  Pain Location: R hip Pain Descriptors / Indicators: Aching, Grimacing, Discomfort Pain Intervention(s): Limited activity within patient's tolerance, Monitored during session, Premedicated before session, Repositioned (nursing notified of pt's request for ice pack)     Hand Dominance     Extremity/Trunk Assessment Upper Extremity Assessment Upper Extremity Assessment: Overall WFL for tasks assessed   Lower Extremity Assessment Lower Extremity Assessment: Generalized weakness;RLE deficits/detail RLE Deficits / Details: s/p R THA       Communication Communication Communication: No difficulties   Cognition Arousal/Alertness: Awake/alert Behavior During Therapy: WFL for tasks assessed/performed Overall Cognitive Status: No family/caregiver present to determine baseline cognitive functioning                                 General Comments: Pt demonstrates impaired insight and awareness for what she will need to be able to do or need assist with upon discharge, required additional instruction and cues throughout session     General Comments       Exercises Other Exercises Other Exercises: Pt educated in role of OT, process for therapy and discharge recommendations, instructed in home/routines modifications, assist needed for various aspects during recovery, AE/DME training, and ADL transfer training.   Shoulder Instructions      Home Living Family/patient expects to be discharged to:: Private residence Living Arrangements:  Alone Available Help at Discharge: Friend(s);Available PRN/intermittently Type of Home: House Home Access: Stairs to enter [employer] of Steps: 4 Entrance Stairs-Rails: Left Home Layout: One level     Bathroom Shower/Tub: Chief Strategy Officer: Handicapped height Bathroom Accessibility: Yes   Home Equipment: None          Prior Functioning/Environment Prior Level of Function : Independent/Modified Independent             Mobility Comments: ind community ambulator but limited distances due to pain; no hx of falls ADLs Comments: ind w/ ADLs        OT Problem List: Decreased strength;Pain;Decreased range of motion;Decreased safety awareness;Decreased cognition;Decreased knowledge of use of DME or AE;Impaired balance (sitting and/or standing)      OT Treatment/Interventions: Self-care/ADL training;Therapeutic exercise;Therapeutic activities;Cognitive remediation/compensation;DME and/or AE instruction;Patient/family education;Balance training    OT Goals(Current goals can be found in the care plan section) Acute Rehab OT Goals Patient Stated Goal: get better and go home OT Goal Formulation: With patient Time For Goal Achievement: 04/14/22 Potential to Achieve Goals: Good ADL Goals Pt Will Perform Lower Body Dressing: with min assist;sit to/from stand;with adaptive equipment;with mod assist Pt Will Transfer to Toilet: stand pivot transfer;bedside commode;with mod assist (LRAD) Pt  Will Perform Toileting - Clothing Manipulation and hygiene: with set-up;with supervision;sitting/lateral leans Additional ADL Goal #1: Pt will independently instruct family/caregiver in compression stocking mgt  OT Frequency: Min 2X/week    Co-evaluation              AM-PAC OT "6 Clicks" Daily Activity     Outcome Measure Help from another person eating meals?: None Help from another person taking care of personal grooming?: A Little Help from another person  toileting, which includes using toliet, bedpan, or urinal?: Total Help from another person bathing (including washing, rinsing, drying)?: A Lot Help from another person to put on and taking off regular upper body clothing?: A Little Help from another person to put on and taking off regular lower body clothing?: A Lot 6 Click Score: 15   End of Session Equipment Utilized During Treatment: Rolling walker (2 wheels) Nurse Communication: Other (comment) (ice pack)  Activity Tolerance: Patient limited by pain Patient left: in bed;with call bell/phone within reach;with bed alarm set  OT Visit Diagnosis: Other abnormalities of gait and mobility (R26.89);Muscle weakness (generalized) (M62.81);Pain Pain - Right/Left: Right Pain - part of body: Hip                Time: [id number] OT Time Calculation (min): 45 min Charges:  OT General Charges $OT Visit: 1 Visit OT Evaluation $OT Eval Moderate Complexity: 1 Mod OT Treatments $Self Care/Home Management : 23-37 mins  Ardeth Perfect., MPH, MS, OTR/L ascom [phone] 03/31/22, 4:58 PM

## 2022-04-01 DIAGNOSIS — M1611 Unilateral primary osteoarthritis, right hip: Secondary | ICD-10-CM | POA: Diagnosis not present

## 2022-04-01 LAB — CBC
HCT: 35.3 % — ABNORMAL LOW (ref 36.0–46.0)
Hemoglobin: 11.2 g/dL — ABNORMAL LOW (ref 12.0–15.0)
MCH: 30.6 pg (ref 26.0–34.0)
MCHC: 31.7 g/dL (ref 30.0–36.0)
MCV: 96.4 fL (ref 80.0–100.0)
Platelets: 331 10*3/uL (ref 150–400)
RBC: 3.66 MIL/uL — ABNORMAL LOW (ref 3.87–5.11)
RDW: 13.1 % (ref 11.5–15.5)
WBC: 10.9 10*3/uL — ABNORMAL HIGH (ref 4.0–10.5)
nRBC: 0 % (ref 0.0–0.2)

## 2022-04-01 NOTE — Progress Notes (Signed)
   Subjective: 2 Days Post-Op Procedure(s) (LRB): TOTAL HIP ARTHROPLASTY ANTERIOR APPROACH (Right) Patient reports pain as mild and moderate.   Patient is well, and has had no acute complaints or problems Denies any CP, SOB, ABD pain. We will continue therapy today.  Plan is to go Home after hospital stay.  Objective: Vital signs in last 24 hours: Temp:  [97.3 F (36.3 C)-98 F (36.7 C)] 98 F (36.7 C) (07/01 0410) Pulse Rate:  [93-103] 98 (07/01 0410) Resp:  [16-18] 18 (07/01 0410) BP: (108-134)/(69-85) 132/85 (07/01 0410) SpO2:  [91 %-94 %] 94 % (07/01 0410)  Intake/Output from previous day: 06/30 0701 - 07/01 0700 In: 300 [P.O.:300] Out: 0  Intake/Output this shift: Total I/O In: 1595 [I.V.:1595] Out: -   Recent Labs    03/30/22 1359 03/31/22 0510 04/01/22 0541  HGB 12.9 11.1* 11.2*   Recent Labs    03/31/22 0510 04/01/22 0541  WBC 9.4 10.9*  RBC 3.60* 3.66*  HCT 34.4* 35.3*  PLT 311 331   Recent Labs    03/30/22 1359 03/31/22 0510  NA  --  133*  K  --  4.2  CL  --  100  CO2  --  27  BUN  --  10  CREATININE 0.54 0.44  GLUCOSE  --  124*  CALCIUM  --  8.6*   No results for input(s): "LABPT", "INR" in the last 72 hours.  EXAM General - Patient is Alert, Appropriate, and Oriented Extremity - Neurovascular intact Sensation intact distally Intact pulses distally Dorsiflexion/Plantar flexion intact Dressing - dressing C/D/I and no drainage Motor Function - intact, moving foot and toes well on exam.  Ambulated 20 feet with physical therapy.  Past Medical History:  Diagnosis Date   Anxiety    Arthritis     Assessment/Plan:   2 Days Post-Op Procedure(s) (LRB): TOTAL HIP ARTHROPLASTY ANTERIOR APPROACH (Right) Principal Problem:   Status post total hip replacement, right  Estimated body mass index is 38.69 kg/m as calculated from the following:   Height as of this encounter: 5\' 7"  (1.702 m).   Weight as of 03/22/22: 112 kg. Advance diet Up  with therapy Labs and VSS Pain well controlled CM to assist with discharge to home with HHPT.  Possible Sunday or Monday.  DVT Prophylaxis - Lovenox, TED hose, and SCDs Weight-Bearing as tolerated to right leg   Tuesday, PA-C [facility] 04/01/2022, 8:10 AM

## 2022-04-01 NOTE — Plan of Care (Signed)

## 2022-04-01 NOTE — Progress Notes (Signed)
Physical Therapy Treatment Patient Details Name: Marie Foley MRN: [id number] DOB: Mar 30, 1956 Today's Date: 04/01/2022   History of Present Illness Pt is a 66 yo F s/p R THA 03/30/22. PMH includes sleep apnea, anxiety, arthritis.    PT Comments    Pt was pleasant and motivated to participate during the session and put forth good effort throughout. Pt reports some improvement w/ pain this session. Pt was able to complete supine to sit w/ minA for RLE management and extra time and effort to complete. Pt able to complete sit to stands w/ CGA  but still requiring bed to be slightly elevated. Pt ambulated 58ft using RW w/ CGA but still very slow and effortful w/ antalgic steps; very reliant on UE support. Pt still has some difficulty when performing turns w/ RW and needs cuing to maintain RW close. SpO2 and HR WNL on room air with no other adverse symptoms reported/observed. Pt will benefit from PT services in a SNF setting upon discharge to safely address deficits listed in patient problem list for decreased caregiver assistance and eventual return to PLOF.    Recommendations for follow up therapy are one component of a multi-disciplinary discharge planning process, led by the attending physician.  Recommendations may be updated based on patient status, additional functional criteria and insurance authorization.  Follow Up Recommendations  Skilled nursing-short term rehab (<3 hours/day) Can patient physically be transported by private vehicle: Yes   Assistance Recommended at Discharge Intermittent Supervision/Assistance  Patient can return home with the following A little help with walking and/or transfers;A little help with bathing/dressing/bathroom;Assistance with cooking/housework;Assist for transportation;Help with stairs or ramp for entrance   Equipment Recommendations  Rolling walker (2 wheels);BSC/3in1    Recommendations for Other Services       Precautions / Restrictions  Precautions Precautions: Anterior Hip Precaution Booklet Issued: Yes (comment) Restrictions Weight Bearing Restrictions: Yes RLE Weight Bearing: Weight bearing as tolerated     Mobility  Bed Mobility Overal bed mobility: Needs Assistance Bed Mobility: Supine to Sit     Supine to sit: Min assist     General bed mobility comments: extra time and effort to complete, minA for RLE management    Transfers Overall transfer level: Needs assistance Equipment used: Rolling walker (2 wheels) Transfers: Sit to/from Stand Sit to Stand: Min guard, From elevated surface           General transfer comment: effortful stand even bed elevated    Ambulation/Gait Ambulation/Gait assistance: Min guard Gait Distance (Feet): 40 Feet Assistive device: Rolling walker (2 wheels) Gait Pattern/deviations: Step-to pattern, Decreased step length - right, Decreased step length - left, Decreased stance time - right, Antalgic Gait velocity: decreased     General Gait Details: slow effortful antalgic gait; very reliant on UE support   Stairs             Wheelchair Mobility    Modified Rankin (Stroke Patients Only)       Balance Overall balance assessment: Needs assistance Sitting-balance support: Bilateral upper extremity supported, Feet supported Sitting balance-Leahy Scale: Good     Standing balance support: Bilateral upper extremity supported, During functional activity Standing balance-Leahy Scale: Fair                              Cognition Arousal/Alertness: Awake/alert Behavior During Therapy: WFL for tasks assessed/performed Overall Cognitive Status: No family/caregiver present to determine baseline cognitive functioning  Exercises Total Joint Exercises Ankle Circles/Pumps: Strengthening, Both, 10 reps Quad Sets: Strengthening, Both, 10 reps Gluteal Sets: Strengthening, Both, 10 reps Heel  Slides: Strengthening, Both, 10 reps (manual resistance) Long Arc Quad: Strengthening, Both, 10 reps (manual resistance)    General Comments        Pertinent Vitals/Pain Pain Assessment Pain Assessment: 0-10 Pain Score: 4  Pain Location: R hip Pain Descriptors / Indicators: Aching, Grimacing, Discomfort Pain Intervention(s): Monitored during session, Premedicated before session, Repositioned    Home Living                          Prior Function            PT Goals (current goals can now be found in the care plan section) Progress towards PT goals: Progressing toward goals    Frequency    BID      PT Plan Current plan remains appropriate    Co-evaluation              AM-PAC PT "6 Clicks" Mobility   Outcome Measure  Help needed turning from your back to your side while in a flat bed without using bedrails?: A Little Help needed moving from lying on your back to sitting on the side of a flat bed without using bedrails?: A Little Help needed moving to and from a bed to a chair (including a wheelchair)?: A Little Help needed standing up from a chair using your arms (e.g., wheelchair or bedside chair)?: A Little Help needed to walk in hospital room?: A Little Help needed climbing 3-5 steps with a railing? : A Little 6 Click Score: 18    End of Session Equipment Utilized During Treatment: Gait belt Activity Tolerance: Patient tolerated treatment well Patient left: in chair;with call bell/phone within reach;with chair alarm set Nurse Communication: Mobility status PT Visit Diagnosis: Other abnormalities of gait and mobility (R26.89);Muscle weakness (generalized) (M62.81);Pain Pain - Right/Left: Right Pain - part of body: Hip     Time: 8341-9622 PT Time Calculation (min) (ACUTE ONLY): 26 min  Charges:                       Marica Otter, SPT  04/01/2022, 10:02 AM

## 2022-04-01 NOTE — Progress Notes (Signed)
Physical Therapy Treatment Patient Details Name: Marie Foley MRN: [id number] DOB: [birth year] Today's Date: 04/01/2022   History of Present Illness Pt is a 66 yo F s/p R THA 03/30/22. PMH includes sleep apnea, anxiety, arthritis.    PT Comments    Pt was pleasant and motivated to participate during the session and put forth good effort throughout. Pt able to complete sit to stands from recliner w/ CGA but reliant on bilat UE support to help during concentric/eccentric portion and needing extra time and effort to complete. Pt able to ambulate 44ft using RW w/ CGA with slow and effortful antalgic gait and still very reliant on UE support. Pt attempted step through pattern but unable due to pain. Pt will benefit from PT services in a SNF setting upon discharge to safely address deficits listed in patient problem list for decreased caregiver assistance and eventual return to PLOF.    Recommendations for follow up therapy are one component of a multi-disciplinary discharge planning process, led by the attending physician.  Recommendations may be updated based on patient status, additional functional criteria and insurance authorization.  Follow Up Recommendations  Skilled nursing-short term rehab (<3 hours/day) Can patient physically be transported by private vehicle: Yes   Assistance Recommended at Discharge Intermittent Supervision/Assistance  Patient can return home with the following A little help with walking and/or transfers;A little help with bathing/dressing/bathroom;Assistance with cooking/housework;Assist for transportation;Help with stairs or ramp for entrance   Equipment Recommendations  Rolling walker (2 wheels);BSC/3in1    Recommendations for Other Services       Precautions / Restrictions Precautions Precautions: Anterior Hip Precaution Booklet Issued: Yes (comment) Restrictions Weight Bearing Restrictions: Yes RLE Weight Bearing: Weight bearing as tolerated      Mobility  Bed Mobility Overal bed mobility: Needs Assistance Bed Mobility: Supine to Sit     Supine to sit: Min assist     General bed mobility comments: Pt in recliner at start/end of session    Transfers Overall transfer level: Needs assistance Equipment used: Rolling walker (2 wheels) Transfers: Sit to/from Stand Sit to Stand: Min guard           General transfer comment: extra time and effort to complete; reliant on bilat UE support from recliner to help initiate    Ambulation/Gait Ambulation/Gait assistance: Min guard Gait Distance (Feet): 40 Feet Assistive device: Rolling walker (2 wheels) Gait Pattern/deviations: Step-to pattern, Decreased step length - right, Decreased step length - left, Decreased stance time - right, Antalgic Gait velocity: decreased     General Gait Details: slow effortful antalgic gait; very reliant on UE support   Stairs             Wheelchair Mobility    Modified Rankin (Stroke Patients Only)       Balance Overall balance assessment: Needs assistance Sitting-balance support: Bilateral upper extremity supported, Feet supported Sitting balance-Leahy Scale: Good     Standing balance support: Bilateral upper extremity supported, During functional activity Standing balance-Leahy Scale: Fair                              Cognition Arousal/Alertness: Awake/alert Behavior During Therapy: WFL for tasks assessed/performed Overall Cognitive Status: No family/caregiver present to determine baseline cognitive functioning  Exercises  General Exercises - Lower Extremity Mini-Sqauts: Strengthening, Both, 10 reps Other Exercises Other Exercises: sit to stand from recliner x5; w/ slow eccentric    General Comments        Pertinent Vitals/Pain Pain Assessment Pain Assessment: 0-10 Pain Score: 5  Pain Location: R hip Pain Descriptors / Indicators:  Aching, Grimacing, Discomfort Pain Intervention(s): Monitored during session, Premedicated before session, Repositioned    Home Living                          Prior Function            PT Goals (current goals can now be found in the care plan section) Progress towards PT goals: Progressing toward goals    Frequency    BID      PT Plan Current plan remains appropriate    Co-evaluation              AM-PAC PT "6 Clicks" Mobility   Outcome Measure  Help needed turning from your back to your side while in a flat bed without using bedrails?: A Little Help needed moving from lying on your back to sitting on the side of a flat bed without using bedrails?: A Little Help needed moving to and from a bed to a chair (including a wheelchair)?: A Little Help needed standing up from a chair using your arms (e.g., wheelchair or bedside chair)?: A Little Help needed to walk in hospital room?: A Little Help needed climbing 3-5 steps with a railing? : A Little 6 Click Score: 18    End of Session Equipment Utilized During Treatment: Gait belt Activity Tolerance: Patient tolerated treatment well Patient left: in chair;with call bell/phone within reach;with chair alarm set Nurse Communication: Mobility status PT Visit Diagnosis: Other abnormalities of gait and mobility (R26.89);Muscle weakness (generalized) (M62.81);Pain Pain - Right/Left: Right Pain - part of body: Hip     Time: 6283-1517 PT Time Calculation (min) (ACUTE ONLY): 23 min  Charges:                       Marica Otter, SPT  04/01/2022, 2:49 PM

## 2022-04-02 DIAGNOSIS — M1611 Unilateral primary osteoarthritis, right hip: Secondary | ICD-10-CM | POA: Diagnosis not present

## 2022-04-02 NOTE — TOC Progression Note (Signed)
Transition of Care [facility]) - Progression Note    Patient Details  Name: Marie Foley MRN: [id number] Date of Birth: 1955/12/23  [facility] CM/SW Contact  Bing Quarry, RN Phone Number: 04/02/2022, 12:29 PM  Clinical Narrative:  Update: PT spoke with providers regarding concerns about discharge plan due to assessed limitations. Both HH and SNF placement are in the works so whichever way is decided collectively, TOC will follow through. RW and 3:1 ordered via [employer] already in progress. Gabriel Cirri RN CM           Expected Discharge Plan and Services                                                 Social Determinants of Health (SDOH) Interventions    Readmission Risk Interventions     No data to display

## 2022-04-02 NOTE — Plan of Care (Signed)

## 2022-04-02 NOTE — TOC Progression Note (Addendum)
Transition of Care [facility]) - Progression Note    Patient Details  Name: Marie Foley MRN: [id number] Date of Birth: 05-11-1956  Transition of Care [facility]) CM/SW Contact  Bing Quarry, RN Phone Number: 04/02/2022, 9:31 AM  Clinical Narrative:  7/2: Spoke with patient regarding update to bed search started 6/30. Patient states provider wants her to go home with [facility] PT as was set up originally via [facility]. Provider notes also indicate this is the plan. DC likely Monday. If plan were to change back to STR/SNF patient first choice would be [facility] for [employer] Patient set up with [facility] to follow on discharge. DME recommended by PT/OT is BSC/3:1 and RW. Patient does not have any of these at home. Requested  and received DME orders from provider. Gabriel Cirri RN CM          Expected Discharge Plan and Services                                                 Social Determinants of Health (SDOH) Interventions    Readmission Risk Interventions     No data to display

## 2022-04-02 NOTE — Progress Notes (Signed)
   Subjective: 3 Days Post-Op Procedure(s) (LRB): TOTAL HIP ARTHROPLASTY ANTERIOR APPROACH (Right) Patient reports pain as mild and moderate.   Patient is well, and has had no acute complaints or problems Denies any CP, SOB, ABD pain. We will continue therapy today.  Plan is to go Home after hospital stay.  Objective: Vital signs in last 24 hours: Temp:  [97.4 F (36.3 C)-98.3 F (36.8 C)] 98.3 F (36.8 C) (07/02 0533) Pulse Rate:  [91-94] 94 (07/02 0533) Resp:  [16-20] 20 (07/02 0533) BP: (113-131)/(75-82) 131/82 (07/02 0533) SpO2:  [93 %-95 %] 95 % (07/02 0533)  Intake/Output from previous day: 07/01 0701 - 07/02 0700 In: 1595 [I.V.:1595] Out: 0  Intake/Output this shift: No intake/output data recorded.  Recent Labs    03/30/22 1359 03/31/22 0510 04/01/22 0541  HGB 12.9 11.1* 11.2*   Recent Labs    03/31/22 0510 04/01/22 0541  WBC 9.4 10.9*  RBC 3.60* 3.66*  HCT 34.4* 35.3*  PLT 311 331   Recent Labs    03/30/22 1359 03/31/22 0510  NA  --  133*  K  --  4.2  CL  --  100  CO2  --  27  BUN  --  10  CREATININE 0.54 0.44  GLUCOSE  --  124*  CALCIUM  --  8.6*   No results for input(s): "LABPT", "INR" in the last 72 hours.  EXAM General - Patient is Alert, Appropriate, and Oriented Extremity - Neurovascular intact Sensation intact distally Intact pulses distally Dorsiflexion/Plantar flexion intact Dressing - dressing C/D/I and no drainage Motor Function - intact, moving foot and toes well on exam.  Ambulated 40 feet with physical therapy.  Past Medical History:  Diagnosis Date   Anxiety    Arthritis     Assessment/Plan:   3 Days Post-Op Procedure(s) (LRB): TOTAL HIP ARTHROPLASTY ANTERIOR APPROACH (Right) Principal Problem:   Status post total hip replacement, right  Estimated body mass index is 38.69 kg/m as calculated from the following:   Height as of this encounter: 5\' 7"  (1.702 m).   Weight as of 03/22/22: 112 kg. Advance diet Up with  therapy Labs and VSS Pain well controlled CM to assist with discharge to home with HHPT.  Probable discharge home Monday.  DVT Prophylaxis - Lovenox, TED hose, and SCDs Weight-Bearing as tolerated to right leg   Tuesday, PA-C [facility] 04/02/2022, 7:58 AM

## 2022-04-02 NOTE — Progress Notes (Signed)
Physical Therapy Treatment Patient Details Name: Marie Foley MRN: [id number] DOB: 1956-03-07 Today's Date: 04/02/2022   History of Present Illness Pt is a 66 yo F s/p R THA 03/30/22. PMH includes sleep apnea, anxiety, arthritis.    PT Comments    Pt was pleasant and motivated to participate during the session and put forth good effort throughout. Pt able to complete sit to stands w/ CGA with verbal cuing needed for a more controlled sit. Pt able to ambulated 10ft x2 using RW w/ CGA but still with a very slow antalgic gait and needing seated rest between. Noticeable improvements with cadence but still fairly reliant on UE support. Pt has some poor safety awareness and needing min cuing to avoid obstacles. Pt will benefit from PT services in a SNF setting upon discharge to safely address deficits listed in patient problem list for decreased caregiver assistance and eventual return to PLOF.    Recommendations for follow up therapy are one component of a multi-disciplinary discharge planning process, led by the attending physician.  Recommendations may be updated based on patient status, additional functional criteria and insurance authorization.  Follow Up Recommendations  Skilled nursing-short term rehab (<3 hours/day) Can patient physically be transported by private vehicle: Yes   Assistance Recommended at Discharge Intermittent Supervision/Assistance  Patient can return home with the following A little help with walking and/or transfers;A little help with bathing/dressing/bathroom;Assistance with cooking/housework;Assist for transportation;Help with stairs or ramp for entrance   Equipment Recommendations  Rolling walker (2 wheels);BSC/3in1    Recommendations for Other Services       Precautions / Restrictions Precautions Precautions: Anterior Hip Precaution Booklet Issued: Yes (comment) Restrictions Weight Bearing Restrictions: Yes RLE Weight Bearing: Weight bearing as tolerated      Mobility  Bed Mobility               General bed mobility comments: Pt in recliner at start/end of session    Transfers Overall transfer level: Needs assistance Equipment used: Rolling walker (2 wheels) Transfers: Sit to/from Stand Sit to Stand: Min guard           General transfer comment: extra time and effort to complete; despite cuing poor eccentric control    Ambulation/Gait Ambulation/Gait assistance: Min guard Gait Distance (Feet): 40 Feet x2 Assistive device: Rolling walker (2 wheels) Gait Pattern/deviations: Step-to pattern, Decreased step length - right, Decreased step length - left, Decreased stance time - right, Antalgic Gait velocity: decreased     General Gait Details: slow antalgic gait; noticeable improvements w/ cadence but still needing UE support from [employer] Mobility    Modified Rankin (Stroke Patients Only)       Balance Overall balance assessment: Needs assistance Sitting-balance support: Bilateral upper extremity supported, Feet supported Sitting balance-Leahy Scale: Good     Standing balance support: Bilateral upper extremity supported, During functional activity Standing balance-Leahy Scale: Fair                              Cognition Arousal/Alertness: Awake/alert Behavior During Therapy: WFL for tasks assessed/performed Overall Cognitive Status: No family/caregiver present to determine baseline cognitive functioning  Exercises Total Joint Exercises Long Arc Quad: Strengthening, Both, 10 reps    General Comments        Pertinent Vitals/Pain Pain Assessment Pain Assessment: 0-10 Pain Score: 5  Pain Location: R hip Pain Descriptors / Indicators: Aching, Grimacing, Discomfort Pain Intervention(s): Monitored during session, Premedicated before session, Repositioned    Home Living                           Prior Function            PT Goals (current goals can now be found in the care plan section) Progress towards PT goals: Progressing toward goals    Frequency    BID      PT Plan Current plan remains appropriate    Co-evaluation              AM-PAC PT "6 Clicks" Mobility   Outcome Measure  Help needed turning from your back to your side while in a flat bed without using bedrails?: A Little Help needed moving from lying on your back to sitting on the side of a flat bed without using bedrails?: A Little Help needed moving to and from a bed to a chair (including a wheelchair)?: A Little Help needed standing up from a chair using your arms (e.g., wheelchair or bedside chair)?: A Little Help needed to walk in hospital room?: A Little Help needed climbing 3-5 steps with a railing? : A Little 6 Click Score: 18    End of Session Equipment Utilized During Treatment: Gait belt Activity Tolerance: Patient tolerated treatment well Patient left: in chair;with call bell/phone within reach;with chair alarm set;with SCD's reapplied Nurse Communication: Mobility status PT Visit Diagnosis: Other abnormalities of gait and mobility (R26.89);Muscle weakness (generalized) (M62.81);Pain Pain - Right/Left: Right Pain - part of body: Hip     Time: 8875-7972 PT Time Calculation (min) (ACUTE ONLY): 30 min  Charges:                        Marica Otter, SPT  04/02/2022, 11:27 AM

## 2022-04-02 NOTE — Progress Notes (Cosign Needed)
Patient is not able to walk the distance required to go the bathroom, or he/she is unable to safely negotiate stairs required to access the bathroom.  A 3in1 BSC will alleviate this problem  

## 2022-04-03 DIAGNOSIS — M1611 Unilateral primary osteoarthritis, right hip: Secondary | ICD-10-CM | POA: Diagnosis not present

## 2022-04-03 LAB — SURGICAL PATHOLOGY

## 2022-04-03 NOTE — Plan of Care (Signed)

## 2022-04-03 NOTE — Progress Notes (Signed)
Physical Therapy Treatment Patient Details Name: JOCLYNN LUMB MRN: [id number] DOB: 1956-03-01 Today's Date: 04/03/2022   History of Present Illness Pt is a 66 yo F s/p R THA 03/30/22. PMH includes sleep apnea, anxiety, arthritis.    PT Comments    Pt was pleasant and motivated to participate during the session and put forth good effort throughout. Focus on stair training this session. Pt able to complete sit to stands w/ CGA but still with poor eccentric control. Pt able to complete 1 step x4 w/ CGA following heavy cuing for sequencing; rest break needed after completing. Progressed to 4 steps x1 using L rail w/ CGA, again, needing heavy cuing for sequencing and with poor carry over following demonstrations. Very slow and effortful with ascent/descent and heavily reliant on UE support. Pt declined further steps due to fatigue. Pt will benefit from PT services in a SNF setting upon discharge to safely address deficits listed in patient problem list for decreased caregiver assistance and eventual return to PLOF.     Recommendations for follow up therapy are one component of a multi-disciplinary discharge planning process, led by the attending physician.  Recommendations may be updated based on patient status, additional functional criteria and insurance authorization.  Follow Up Recommendations  Skilled nursing-short term rehab (<3 hours/day) Can patient physically be transported by private vehicle: Yes   Assistance Recommended at Discharge Intermittent Supervision/Assistance  Patient can return home with the following A little help with walking and/or transfers;A little help with bathing/dressing/bathroom;Assistance with cooking/housework;Assist for transportation;Help with stairs or ramp for entrance   Equipment Recommendations  Rolling walker (2 wheels);BSC/3in1    Recommendations for Other Services       Precautions / Restrictions Precautions Precautions: Anterior Hip Precaution  Booklet Issued: Yes (comment) Restrictions Weight Bearing Restrictions: Yes RLE Weight Bearing: Weight bearing as tolerated     Mobility  Bed Mobility Overal bed mobility: Needs Assistance Bed Mobility: Supine to Sit, Sit to Supine     Supine to sit: Min guard Sit to supine: Min guard   General bed mobility comments: Pt in recliner at start/end of session    Transfers Overall transfer level: Needs assistance Equipment used: Rolling walker (2 wheels) Transfers: Sit to/from Stand Sit to Stand: Min guard           General transfer comment: extra time and effort to complete; poor eccentric control    Ambulation/Gait Ambulation/Gait assistance: Min guard Gait Distance (Feet): 4 Feet x2 Assistive device: Rolling walker (2 wheels) Gait Pattern/deviations: Step-to pattern, Decreased step length - right, Decreased step length - left, Decreased stance time - right, Antalgic Gait velocity: decreased     General Gait Details: slow antalgic gait, reliant on UE support   Stairs Stairs: Yes Stairs assistance: Min guard Stair Management: One rail Left, Two rails, Step to pattern, Forwards, Backwards Number of Stairs: 4 x1; 1 x4 General stair comments: very effortful and reliant on UE support; heavy cuing needed for sequencing   Wheelchair Mobility    Modified Rankin (Stroke Patients Only)       Balance Overall balance assessment: Needs assistance Sitting-balance support: Feet supported Sitting balance-Leahy Scale: Good     Standing balance support: Bilateral upper extremity supported, During functional activity Standing balance-Leahy Scale: Fair                              Cognition Arousal/Alertness: Awake/alert Behavior During Therapy: WFL for tasks assessed/performed Overall  Cognitive Status: No family/caregiver present to determine baseline cognitive functioning                                          Exercises Other  Exercises Other Exercises: Pt education on sequencing for stairs    General Comments        Pertinent Vitals/Pain Pain Assessment Pain Assessment: 0-10 Pain Score: 7  Pain Location: R hip, increased with weightbearing Pain Descriptors / Indicators: Aching, Grimacing, Discomfort Pain Intervention(s): Monitored during session, Premedicated before session, Repositioned    Home Living                          Prior Function            PT Goals (current goals can now be found in the care plan section) Progress towards PT goals: Progressing toward goals    Frequency    BID      PT Plan Current plan remains appropriate    Co-evaluation              AM-PAC PT "6 Clicks" Mobility   Outcome Measure  Help needed turning from your back to your side while in a flat bed without using bedrails?: A Little Help needed moving from lying on your back to sitting on the side of a flat bed without using bedrails?: A Little Help needed moving to and from a bed to a chair (including a wheelchair)?: A Little Help needed standing up from a chair using your arms (e.g., wheelchair or bedside chair)?: A Little Help needed to walk in hospital room?: A Little Help needed climbing 3-5 steps with a railing? : A Little 6 Click Score: 18    End of Session Equipment Utilized During Treatment: Gait belt Activity Tolerance: Patient tolerated treatment well Patient left: in chair;with call bell/phone within reach;with chair alarm set;with SCD's reapplied Nurse Communication: Mobility status PT Visit Diagnosis: Other abnormalities of gait and mobility (R26.89);Muscle weakness (generalized) (M62.81);Pain Pain - Right/Left: Right Pain - part of body: Hip     Time: 1319-1350 PT Time Calculation (min) (ACUTE ONLY): 31 min  Charges:                     Marica Otter, SPT  04/03/2022, 3:22 PM

## 2022-04-03 NOTE — Progress Notes (Signed)
   Subjective: 4 Days Post-Op Procedure(s) (LRB): TOTAL HIP ARTHROPLASTY ANTERIOR APPROACH (Right) Patient reports pain as mild and moderate.   Patient is well, and has had no acute complaints or problems Denies any CP, SOB, ABD pain. We will continue therapy today.  Plan is to go Home after hospital stay.  Objective: Vital signs in last 24 hours: Temp:  [97.7 F (36.5 C)-98.7 F (37.1 C)] 98 F (36.7 C) (07/03 0859) Pulse Rate:  [87-99] 99 (07/03 0859) Resp:  [17-18] 18 (07/03 0603) BP: (108-135)/(65-87) 130/81 (07/03 0859) SpO2:  [93 %-96 %] 96 % (07/03 0859)  Intake/Output from previous day: No intake/output data recorded. Intake/Output this shift: Total I/O In: 240 [P.O.:240] Out: -   Recent Labs    04/01/22 0541  HGB 11.2*   Recent Labs    04/01/22 0541  WBC 10.9*  RBC 3.66*  HCT 35.3*  PLT 331   No results for input(s): "NA", "K", "CL", "CO2", "BUN", "CREATININE", "GLUCOSE", "CALCIUM" in the last 72 hours.  No results for input(s): "LABPT", "INR" in the last 72 hours.  EXAM General - Patient is Alert, Appropriate, and Oriented Extremity - Neurovascular intact Sensation intact distally Intact pulses distally Dorsiflexion/Plantar flexion intact Dressing - dressing C/D/I and no drainage Motor Function - intact, moving foot and toes well on exam.  Ambulated 40 feet twice with physical therapy.  Past Medical History:  Diagnosis Date   Anxiety    Arthritis     Assessment/Plan:   4 Days Post-Op Procedure(s) (LRB): TOTAL HIP ARTHROPLASTY ANTERIOR APPROACH (Right) Principal Problem:   Status post total hip replacement, right  Estimated body mass index is 38.69 kg/m as calculated from the following:   Height as of this encounter: 5\' 7"  (1.702 m).   Weight as of 03/22/22: 112 kg. Advance diet Up with therapy Labs and VSS Pain well controlled CM to assist with discharge to home with HHPT.  Probable discharge home Tuesday.  She is trying to get family  or friend support at home.Sunday  DVT Prophylaxis - Lovenox, TED hose, and SCDs Weight-Bearing as tolerated to right leg   Marland Kitchen, PA-C [facility] 04/03/2022, 11:02 AM

## 2022-04-03 NOTE — TOC Progression Note (Addendum)
Transition of Care [facility]) - Progression Note    Patient Details  Name: Marie Foley MRN: [id number] Date of Birth: 01-03-1956  [facility]) CM/SW Contact  Margarito Liner, LCSW Phone Number: 04/03/2022, 1:37 PM  Clinical Narrative:   Went by room to discuss bed offers but patient was not in the room. Will try again later.  3:15 pm: Patient still undecided about SNF vs home health. She is agreeable to holding bed at [facility]. Asked CMA will start insurance authorization. She will make decision by tomorrow. If she goes home, her coworker Marie Foley will probably take her home.  Expected Discharge Plan and Services                                                 Social Determinants of Health (SDOH) Interventions    Readmission Risk Interventions     No data to display

## 2022-04-03 NOTE — Progress Notes (Signed)
Physical Therapy Treatment Patient Details Name: Marie Foley MRN: [id number] DOB: September 23, 1956 Today's Date: 04/03/2022   History of Present Illness Pt is a 66 yo F s/p R THA 03/30/22. PMH includes sleep apnea, anxiety, arthritis.    PT Comments    Pt was pleasant and motivated to participate during the session and put forth good effort throughout. Pt is able to complete bed mobility w/ CGA from a flat bed but effortful and needing extra time to complete. Pt able to perform sit to stands w/ CGA with improvements initiating stand but still having difficulty with eccentric control despite verbal cues. Pt ambulated 37ft using RW w/ CGA but still very slow antalgic gait; slowly improving cadence but still reliant for UE support to RW. Pt will benefit from PT services in a SNF setting upon discharge to safely address deficits listed in patient problem list for decreased caregiver assistance and eventual return to PLOF.    Recommendations for follow up therapy are one component of a multi-disciplinary discharge planning process, led by the attending physician.  Recommendations may be updated based on patient status, additional functional criteria and insurance authorization.  Follow Up Recommendations  Skilled nursing-short term rehab (<3 hours/day) Can patient physically be transported by private vehicle: Yes   Assistance Recommended at Discharge Intermittent Supervision/Assistance  Patient can return home with the following A little help with walking and/or transfers;A little help with bathing/dressing/bathroom;Assistance with cooking/housework;Assist for transportation;Help with stairs or ramp for entrance   Equipment Recommendations  Rolling walker (2 wheels);BSC/3in1    Recommendations for Other Services       Precautions / Restrictions Precautions Precautions: Anterior Hip Precaution Booklet Issued: Yes (comment) Restrictions Weight Bearing Restrictions: Yes RLE Weight Bearing:  Weight bearing as tolerated     Mobility  Bed Mobility Overal bed mobility: Needs Assistance Bed Mobility: Supine to Sit, Sit to Supine     Supine to sit: Min guard Sit to supine: Min guard   General bed mobility comments: extra time and effort to complete    Transfers Overall transfer level: Needs assistance Equipment used: Rolling walker (2 wheels) Transfers: Sit to/from Stand Sit to Stand: Min guard           General transfer comment: extra time and effort to complete; verbal cuing for eccentric control    Ambulation/Gait Ambulation/Gait assistance: Min guard Gait Distance (Feet): 40 Feet Assistive device: Rolling walker (2 wheels) Gait Pattern/deviations: Step-to pattern, Decreased step length - right, Decreased step length - left, Decreased stance time - right, Antalgic Gait velocity: decreased     General Gait Details: slow antalgic gait; noticeable improvements w/ cadence but still needing UE support from [employer] Mobility    Modified Rankin (Stroke Patients Only)       Balance Overall balance assessment: Needs assistance Sitting-balance support: Feet supported Sitting balance-Leahy Scale: Good     Standing balance support: Bilateral upper extremity supported, During functional activity Standing balance-Leahy Scale: Fair                              Cognition Arousal/Alertness: Awake/alert Behavior During Therapy: WFL for tasks assessed/performed Overall Cognitive Status: No family/caregiver present to determine baseline cognitive functioning  Exercises Other Exercises Other Exercises: Mutiple sit <> supine with cuing and education on sequencing    General Comments        Pertinent Vitals/Pain Pain Assessment Pain Assessment: 0-10 Pain Score: 7  Pain Location: R hip Pain Descriptors / Indicators: Aching, Grimacing, Discomfort Pain  Intervention(s): Monitored during session, Premedicated before session, Repositioned    Home Living                          Prior Function            PT Goals (current goals can now be found in the care plan section) Progress towards PT goals: Progressing toward goals    Frequency    BID      PT Plan Current plan remains appropriate    Co-evaluation              AM-PAC PT "6 Clicks" Mobility   Outcome Measure  Help needed turning from your back to your side while in a flat bed without using bedrails?: A Little Help needed moving from lying on your back to sitting on the side of a flat bed without using bedrails?: A Little Help needed moving to and from a bed to a chair (including a wheelchair)?: A Little Help needed standing up from a chair using your arms (e.g., wheelchair or bedside chair)?: A Little Help needed to walk in hospital room?: A Little Help needed climbing 3-5 steps with a railing? : A Little 6 Click Score: 18    End of Session Equipment Utilized During Treatment: Gait belt Activity Tolerance: Patient tolerated treatment well Patient left: in chair;with call bell/phone within reach;with chair alarm set;with SCD's reapplied Nurse Communication: Mobility status PT Visit Diagnosis: Other abnormalities of gait and mobility (R26.89);Muscle weakness (generalized) (M62.81);Pain Pain - Right/Left: Right Pain - part of body: Hip     Time: 3500-9381 PT Time Calculation (min) (ACUTE ONLY): 25 min  Charges:                        Marica Otter, SPT  04/03/2022, 1:01 PM

## 2022-04-03 NOTE — Progress Notes (Signed)
Occupational Therapy Treatment Patient Details Name: Marie Foley MRN: [id number] DOB: [birth year] Today's Date: 04/03/2022   History of present illness Pt is a 66 yo F s/p R THA 03/30/22. PMH includes sleep apnea, anxiety, arthritis.   OT comments  Pt seen for OT tx this date. Pt in recliner, endorsing eagerness to return home and feeling more "herself." Pt does appear slightly cognitively more clear but continues to demonstrate impairments in problem solving and insight during session when prompted to help identify barriers to discharge home since she endorses no assist available. She required less assist this session for ADL transfers and mobility, however continues to require significant assist for LB ADL tasks 2/2 pain with hip flexion. Continue to recommend SNF at this time to maximize safety/indep.    Recommendations for follow up therapy are one component of a multi-disciplinary discharge planning process, led by the attending physician.  Recommendations may be updated based on patient status, additional functional criteria and insurance authorization.    Follow Up Recommendations  Skilled nursing-short term rehab (<3 hours/day)    Assistance Recommended at Discharge Frequent or constant Supervision/Assistance  Patient can return home with the following  A lot of help with bathing/dressing/bathroom;Assistance with cooking/housework;Assist for transportation;Help with stairs or ramp for entrance;Direct supervision/assist for medications management;Direct supervision/assist for financial management;A little help with walking and/or transfers   Equipment Recommendations  BSC/3in1;Other (comment) (2WW)    Recommendations for Other Services      Precautions / Restrictions Precautions Precautions: Anterior Hip Precaution Booklet Issued: Yes (comment) Restrictions Weight Bearing Restrictions: Yes RLE Weight Bearing: Weight bearing as tolerated       Mobility Bed Mobility                General bed mobility comments: NT, up in recliner    Transfers Overall transfer level: Needs assistance Equipment used: Rolling walker (2 wheels) Transfers: Sit to/from Stand Sit to Stand: Min guard           General transfer comment: increased time/effort, R hip pain     Balance Overall balance assessment: Needs assistance Sitting-balance support: Feet supported Sitting balance-Leahy Scale: Good     Standing balance support: Bilateral upper extremity supported, During functional activity, Single extremity supported, No upper extremity supported Standing balance-Leahy Scale: Fair Standing balance comment: able to stand briefly without UE support to wash hands and prepare toothbrush                           ADL either performed or assessed with clinical judgement   ADL                                         General ADL Comments: Pt continues to require significant assist for LB ADL tasks, CGA for ADL transfers, set up and supv for standing grooming tasks in bathroom    Extremity/Trunk Assessment              Vision       Perception     Praxis      Cognition Arousal/Alertness: Awake/alert Behavior During Therapy: WFL for tasks assessed/performed Overall Cognitive Status: No family/caregiver present to determine baseline cognitive functioning  General Comments: Pt continues to demonstrate impaired insight and problem solving        Exercises      Shoulder Instructions       General Comments      Pertinent Vitals/ Pain       Pain Assessment Pain Assessment: 0-10 Pain Score: 6  Pain Location: R hip, worse with weightbearing Pain Descriptors / Indicators: Aching, Grimacing, Discomfort Pain Intervention(s): Limited activity within patient's tolerance, Monitored during session, Premedicated before session, Repositioned  Home Living                                           Prior Functioning/Environment              Frequency  Min 2X/week        Progress Toward Goals  OT Goals(current goals can now be found in the care plan section)  Progress towards OT goals: Progressing toward goals  Acute Rehab OT Goals Patient Stated Goal: get better and go home OT Goal Formulation: With patient Time For Goal Achievement: 04/14/22 Potential to Achieve Goals: Good  Plan Discharge plan remains appropriate;Discharge plan needs to be updated    Co-evaluation                 AM-PAC OT "6 Clicks" Daily Activity     Outcome Measure   Help from another person eating meals?: None Help from another person taking care of personal grooming?: A Little Help from another person toileting, which includes using toliet, bedpan, or urinal?: A Lot Help from another person bathing (including washing, rinsing, drying)?: A Lot Help from another person to put on and taking off regular upper body clothing?: A Little Help from another person to put on and taking off regular lower body clothing?: A Lot 6 Click Score: 16    End of Session Equipment Utilized During Treatment: Gait belt;Rolling walker (2 wheels)  OT Visit Diagnosis: Other abnormalities of gait and mobility (R26.89);Muscle weakness (generalized) (M62.81);Pain Pain - Right/Left: Right Pain - part of body: Hip   Activity Tolerance Patient tolerated treatment well   Patient Left with call bell/phone within reach;in chair   Nurse Communication          Time: 0941-1000 OT Time Calculation (min): 19 min  Charges: OT General Charges $OT Visit: 1 Visit OT Treatments $Self Care/Home Management : 8-22 mins  Arman Filter., MPH, MS, OTR/L ascom [phone] 04/03/22, 1:14 PM

## 2022-04-04 DIAGNOSIS — M1611 Unilateral primary osteoarthritis, right hip: Secondary | ICD-10-CM | POA: Diagnosis not present

## 2022-04-04 NOTE — TOC Progression Note (Signed)
Transition of Care [facility]) - Progression Note    Patient Details  Name: Marie Foley MRN: [id number] Date of Birth: Dec 04, 1955  [facility]) CM/SW Contact  Margarito Liner, LCSW Phone Number: 04/04/2022, 10:39 AM  Clinical Narrative:  SNF insurance authorization approved but patient has decided she prefers to return home with home health. MD is aware. Confirmed her wound vac is a prevena. Left message for [facility] representative to notify. RW and 3-in-1 are already in the room.  Expected Discharge Plan and Services                                                 Social Determinants of Health (SDOH) Interventions    Readmission Risk Interventions     No data to display

## 2022-04-04 NOTE — Progress Notes (Signed)
Physical Therapy Treatment Patient Details Name: Marie Foley MRN: [id number] DOB: [birth year] Today's Date: 04/04/2022   History of Present Illness Pt is a 66 yo F s/p R THA 03/30/22. PMH includes sleep apnea, anxiety, arthritis.    PT Comments    Pt exhibits improvement with ambulation and activity tolerance with ability to ambulate for longest bout of hospital stay. She demonstrates decreased ability to accept weight through R hip with increased heel contact and right antalgic gait and increased use of UE support with use of FWW. PT explained to pt that recommendation made for SNF given that she needs to consistently perform functional activities such as ambulation and stairs before she would be deemed safe enough to return home. Pt expressed desire for HHPT despite explanation by PT and declined opportunity to continue with attempting stairs due to elevated pain level. Pt will benefit from further PT at SNF to improve strength and ROM of right hip as well as improve ambulation and mobility to return home safely and live independently.     Recommendations for follow up therapy are one component of a multi-disciplinary discharge planning process, led by the attending physician.  Recommendations may be updated based on patient status, additional functional criteria and insurance authorization.  Follow Up Recommendations  Skilled nursing-short term rehab (<3 hours/day) Can patient physically be transported by private vehicle: Yes   Assistance Recommended at Discharge Intermittent Supervision/Assistance  Patient can return home with the following A little help with walking and/or transfers;A little help with bathing/dressing/bathroom;Assistance with cooking/housework;Assist for transportation;Help with stairs or ramp for entrance   Equipment Recommendations  Rolling walker (2 wheels);BSC/3in1    Recommendations for Other Services       Precautions / Restrictions Precautions Precautions:  Anterior Hip Precaution Booklet Issued: Yes (comment) Precaution Comments: Reviewed precautions since book had already been given to pt in previous session. Restrictions Weight Bearing Restrictions: Yes RLE Weight Bearing: Weight bearing as tolerated     Mobility  Bed Mobility               General bed mobility comments: Did not assess as patient was already seated upon entering the room    Transfers Overall transfer level: Modified independent Equipment used: Rolling walker (2 wheels) Transfers: Sit to/from Stand Sit to Stand: Supervision           General transfer comment: SBA to complete with UE support and increased time    Ambulation/Gait Ambulation/Gait assistance: Supervision Gait Distance (Feet): 250 Feet Assistive device: Rolling walker (2 wheels) Gait Pattern/deviations: Step-to pattern, Decreased step length - right, Decreased step length - left, Decreased stance time - right, Antalgic Gait velocity: decreased Gait velocity interpretation: <1.8 ft/sec, indicate of risk for recurrent falls   General Gait Details: Increased use of UE to support upright posture.   Stairs         General stair comments: Did not want to attempt stairs because she reports already having done them and feeling confident she could go home safely   Wheelchair Mobility    Modified Rankin (Stroke Patients Only)       Balance Overall balance assessment: Modified Independent Sitting-balance support: No upper extremity supported, Feet unsupported Sitting balance-Leahy Scale: Good     Standing balance support: Bilateral upper extremity supported, During functional activity Standing balance-Leahy Scale: Fair Standing balance comment: Used UE support, did not assess whether she needed UE support to perform static balance  High level balance activites: Backward walking High Level Balance Comments: Took backwards steps to bed side chair             Cognition Arousal/Alertness: Awake/alert Behavior During Therapy: WFL for tasks assessed/performed Overall Cognitive Status: No family/caregiver present to determine baseline cognitive functioning                                          Exercises      General Comments        Pertinent Vitals/Pain Pain Assessment Pain Assessment: 0-10 Pain Score: 5  Pain Location: R hip, increased with weightbearing Pain Descriptors / Indicators: Aching, Grimacing, Discomfort Pain Intervention(s): Monitored during session    Home Living                          Prior Function            PT Goals (current goals can now be found in the care plan section) Acute Rehab PT Goals Patient Stated Goal: pt would like to get back to walking and living normal independent life PT Goal Formulation: With patient Time For Goal Achievement: 04/12/22 Potential to Achieve Goals: Good Progress towards PT goals: Progressing toward goals    Frequency    BID      PT Plan Current plan remains appropriate    Co-evaluation              AM-PAC PT "6 Clicks" Mobility   Outcome Measure  Help needed turning from your back to your side while in a flat bed without using bedrails?: A Little Help needed moving from lying on your back to sitting on the side of a flat bed without using bedrails?: A Little Help needed moving to and from a bed to a chair (including a wheelchair)?: A Little Help needed standing up from a chair using your arms (e.g., wheelchair or bedside chair)?: A Little Help needed to walk in hospital room?: A Little Help needed climbing 3-5 steps with a railing? : A Little 6 Click Score: 18    End of Session Equipment Utilized During Treatment: Gait belt Activity Tolerance: Patient tolerated treatment well Patient left: in chair;with call bell/phone within reach;with chair alarm set;with SCD's reapplied Nurse Communication: Mobility status PT Visit  Diagnosis: Other abnormalities of gait and mobility (R26.89);Muscle weakness (generalized) (M62.81);Pain Pain - Right/Left: Right Pain - part of body: Hip     Time: 5053-9767 PT Time Calculation (min) (ACUTE ONLY): 15 min  Charges:  $Gait Training: 8-22 mins                     Ellin Goodie PT, DPT  04/04/2022, 2:19 PM

## 2022-04-04 NOTE — Progress Notes (Signed)
DISCHARGE NOTE:  Pt given discharge instructions , pt verbalized understanding. Provina vac in place, extra honeycomb sent. Walker and [facility] sent with pt. Pt wheeled to car by staff. Friend providing transportation.

## 2022-04-04 NOTE — TOC Transition Note (Signed)
Transition of Care [facility]) - CM/SW Discharge Note   Patient Details  Name: VERNESHA TALBOT MRN: [id number] Date of Birth: 12/21/1955  Transition of Care [facility]) CM/SW Contact:  Margarito Liner, LCSW Phone Number: 04/04/2022, 12:18 PM   Clinical Narrative:   Patient has orders to discharge home today. [facility] representative is aware. No further concerns. CSW signing off.  Final next level of care: Home w Home Health Services Barriers to Discharge: Barriers Resolved   Patient Goals and CMS Choice     Choice offered to / list presented to : Patient  Discharge Placement                Patient to be transferred to facility by: Annell Greening   Patient and family notified of of transfer: 04/04/22  Discharge Plan and Services                          HH Arranged: PT [facility] Agency: Other - See comment Cindie Laroche) Date [facility] Agency Contacted: 04/04/22   Representative spoke with at [facility] Agency: Crist Infante  Social Determinants of Health (SDOH) Interventions     Readmission Risk Interventions     No data to display

## 2022-04-04 NOTE — Plan of Care (Signed)
  Problem: Activity: Goal: Risk for activity intolerance will decrease Outcome: Progressing   Problem: Pain Managment: Goal: General experience of comfort will improve Outcome: Progressing   

## 2022-04-04 NOTE — Progress Notes (Signed)
  Subjective: 5 Days Post-Op Procedure(s) (LRB): TOTAL HIP ARTHROPLASTY ANTERIOR APPROACH (Right) Patient reports pain as well-controlled.   Patient is well, and has had no acute complaints or problems Plan is to go Home after hospital stay. Negative for chest pain and shortness of breath Fever: no Gastrointestinal: negative for nausea and vomiting.  Patient has had a bowel movement.  Objective: Vital signs in last 24 hours: Temp:  [97.9 F (36.6 C)-98.1 F (36.7 C)] 98.1 F (36.7 C) (07/04 0839) Pulse Rate:  [81-98] 81 (07/04 0839) Resp:  [17-18] 18 (07/04 0839) BP: (115-133)/(76-86) 127/86 (07/04 0839) SpO2:  [94 %-95 %] 95 % (07/04 0839)  Intake/Output from previous day:  Intake/Output Summary (Last 24 hours) at 04/04/2022 1136 Last data filed at 04/03/2022 1413 Gross per 24 hour  Intake 120 ml  Output --  Net 120 ml    Intake/Output this shift: No intake/output data recorded.  Labs: No results for input(s): "HGB" in the last 72 hours. No results for input(s): "WBC", "RBC", "HCT", "PLT" in the last 72 hours. No results for input(s): "NA", "K", "CL", "CO2", "BUN", "CREATININE", "GLUCOSE", "CALCIUM" in the last 72 hours. No results for input(s): "LABPT", "INR" in the last 72 hours.   EXAM General - Patient is Alert, Appropriate, and Oriented Extremity - Neurovascular intact Dorsiflexion/Plantar flexion intact Compartment soft Dressing/Incision -wound vac in place, no drainage in cannister Motor Function - intact, moving foot and toes well on exam.    Assessment/Plan: 5 Days Post-Op Procedure(s) (LRB): TOTAL HIP ARTHROPLASTY ANTERIOR APPROACH (Right) Principal Problem:   Status post total hip replacement, right  Estimated body mass index is 38.69 kg/m as calculated from the following:   Height as of this encounter: 5\' 7"  (1.702 m).   Weight as of 03/22/22: 112 kg. Advance diet Up with therapy  Seen with Dr. 03/24/22, patient expresses desire to discharge to home.  Dr Rosita Kea in agreement, portable prevena attached.     DVT Prophylaxis - Lovenox, Ted hose, and SCDs Weight-Bearing as tolerated to right leg  Marguarite Arbour, PA-C [facility] 04/04/2022, 11:36 AM

## 2022-04-04 NOTE — Progress Notes (Signed)
Occupational Therapy Treatment Patient Details Name: Marie Foley MRN: [id number] DOB: 11/11/1955 Today's Date: 04/04/2022   History of present illness Pt is a 66 yo F s/p R THA 03/30/22. PMH includes sleep apnea, anxiety, arthritis.   OT comments  Pt seen for OT tx this date. Pt reports being eager to discharge home today. Pt instructed in falls prevention and AE for LB ADL tasks. Pt able to return demo with reacher to don pants over feet in sitting and able to complete donning over hips in standing without UE Support on the RW and without LOB. Pt able to doff socks using AE but required assist to don socks 2/2 decr strength, ROM, and RLE pain with hip flexion. Pt instructed in falls prevention and benefits of additional skilled OT Services to maximize return to PLOF and minimize risk of falls/injury. Pt verbalized understanding. Pt continues to be adamant in discharge home. Continues to demonstrate some slight impairments in problem solving and insight.    Recommendations for follow up therapy are one component of a multi-disciplinary discharge planning process, led by the attending physician.  Recommendations may be updated based on patient status, additional functional criteria and insurance authorization.    Follow Up Recommendations  Follow physician's recommendations for discharge plan and follow up therapies    Assistance Recommended at Discharge Intermittent Supervision/Assistance  Patient can return home with the following  Assistance with cooking/housework;Assist for transportation;Help with stairs or ramp for entrance;Direct supervision/assist for medications management;A little help with walking and/or transfers;A little help with bathing/dressing/bathroom   Equipment Recommendations  BSC/3in1;Other (comment) (2WW)    Recommendations for Other Services      Precautions / Restrictions Precautions Precautions: Anterior Hip Restrictions Weight Bearing Restrictions: Yes RLE  Weight Bearing: Weight bearing as tolerated       Mobility Bed Mobility Overal bed mobility: Needs Assistance Bed Mobility: Supine to Sit     Supine to sit: Supervision          Transfers Overall transfer level: Needs assistance Equipment used: Rolling walker (2 wheels) Transfers: Sit to/from Stand Sit to Stand: Min guard, Supervision           General transfer comment: SBA to complete and no LOB     Balance Overall balance assessment: Needs assistance Sitting-balance support: Feet supported Sitting balance-Leahy Scale: Good     Standing balance support: No upper extremity supported, During functional activity Standing balance-Leahy Scale: Fair                             ADL either performed or assessed with clinical judgement   ADL                                         General ADL Comments: Pt completed LB dressing with MIN A to don grippy socks. Pt able to doff grippy socks with reacher after initial instruction but unable to get them back on. Pt instructed in reacher for LB dressing and pt able to retun demo to don pants over feet in sitting, CGA to complete donning over hips in standing with no LOB.    Extremity/Trunk Assessment              Vision       Perception     Praxis      Cognition Arousal/Alertness: Awake/alert Behavior  During Therapy: WFL for tasks assessed/performed Overall Cognitive Status: No family/caregiver present to determine baseline cognitive functioning                                 General Comments: Pt appears to be improving however continues to demonstrate some slight impairments in insight and problem solving        Exercises Other Exercises Other Exercises: Pt instructed in falls prevention and benefits of additional skilled OT Services to maximize return to PLOF and minimize risk of falls/injury. Pt verbalized understanding.    Shoulder Instructions        General Comments      Pertinent Vitals/ Pain       Pain Assessment Pain Assessment: 0-10 Pain Score: 5  Pain Location: R hip, increased with weightbearing Pain Descriptors / Indicators: Aching, Grimacing, Discomfort Pain Intervention(s): Limited activity within patient's tolerance, Monitored during session, Repositioned, Premedicated before session  Home Living                                          Prior Functioning/Environment              Frequency  Min 2X/week        Progress Toward Goals  OT Goals(current goals can now be found in the care plan section)  Progress towards OT goals: Progressing toward goals  Acute Rehab OT Goals Patient Stated Goal: get beter and go home OT Goal Formulation: With patient Time For Goal Achievement: 04/14/22 Potential to Achieve Goals: Good  Plan Discharge plan needs to be updated;Frequency remains appropriate    Co-evaluation                 AM-PAC OT "6 Clicks" Daily Activity     Outcome Measure   Help from another person eating meals?: None Help from another person taking care of personal grooming?: A Little Help from another person toileting, which includes using toliet, bedpan, or urinal?: A Little Help from another person bathing (including washing, rinsing, drying)?: A Little Help from another person to put on and taking off regular upper body clothing?: A Little Help from another person to put on and taking off regular lower body clothing?: A Little 6 Click Score: 19    End of Session Equipment Utilized During Treatment: Rolling walker (2 wheels)  OT Visit Diagnosis: Other abnormalities of gait and mobility (R26.89);Muscle weakness (generalized) (M62.81);Pain Pain - Right/Left: Right Pain - part of body: Hip   Activity Tolerance Patient tolerated treatment well   Patient Left Other (comment) (standing EOB with RW and PT for session)   Nurse Communication          Time:  7829-5621 OT Time Calculation (min): 11 min  Charges: OT General Charges $OT Visit: 1 Visit OT Treatments $Self Care/Home Management : 8-22 mins  Arman Filter., MPH, MS, OTR/L ascom [phone] 04/04/22, 9:07 AM

## 2022-08-23 ENCOUNTER — Other Ambulatory Visit: Payer: Self-pay | Admitting: Internal Medicine

## 2022-08-23 DIAGNOSIS — I5022 Chronic systolic (congestive) heart failure: Secondary | ICD-10-CM

## 2022-09-06 ENCOUNTER — Encounter (HOSPITAL_COMMUNITY): Payer: Self-pay

## 2022-09-06 ENCOUNTER — Telehealth (HOSPITAL_COMMUNITY): Payer: Self-pay | Admitting: *Deleted

## 2022-09-06 MED ORDER — METOPROLOL TARTRATE 100 MG PO TABS: ORAL_TABLET | ORAL | 0 refills | Status: AC

## 2022-09-06 MED ORDER — IVABRADINE HCL 7.5 MG PO TABS: ORAL_TABLET | ORAL | 0 refills | Status: AC

## 2022-09-06 NOTE — Telephone Encounter (Signed)
Reaching out to patient to offer assistance regarding upcoming cardiac imaging study; pt verbalizes understanding of appt date/time, parking situation and where to check in, pre-test NPO status and medications ordered, and verified current allergies; name and call back number provided for further questions should they arise  Larey Brick RN Navigator Cardiac Imaging [facility] [phone] office [phone] cell  Patient to take 100mg  metoprolol tartrate two and 15mg  ivabradine two hours prior to her cardiac CT scan.

## 2022-09-07 ENCOUNTER — Ambulatory Visit
Admission: RE | Admit: 2022-09-07 | Discharge: 2022-09-07 | Disposition: A | Discharge status: Home / Self Care | Payer: Medicare HMO | Source: Ambulatory Visit | Attending: Internal Medicine | Admitting: Internal Medicine

## 2022-09-07 DIAGNOSIS — I5022 Chronic systolic (congestive) heart failure: Secondary | ICD-10-CM

## 2022-09-07 LAB — POCT I-STAT CREATININE: Creatinine, Ser: 0.6 mg/dL (ref 0.44–1.00)

## 2022-09-07 MED ORDER — NITROGLYCERIN 0.4 MG SL SUBL
0.8000 mg | SUBLINGUAL_TABLET | Freq: Once | SUBLINGUAL | Status: AC
  Administered 2022-09-07: 0.8 mg via SUBLINGUAL

## 2022-09-07 MED ORDER — IOHEXOL 350 MG/ML SOLN
100.0000 mL | Freq: Once | INTRAVENOUS | Status: AC | PRN
  Administered 2022-09-07: 100 mL via INTRAVENOUS

## 2022-09-07 MED ORDER — METOPROLOL TARTRATE 5 MG/5ML IV SOLN: 10.0000 mg | Freq: Once | INTRAVENOUS | Status: DC

## 2022-09-07 NOTE — Progress Notes (Signed)
Patient tolerated procedure well. Ambulate w/o difficulty. Denies light headedness or being dizzy. Sitting in chair drinking coffee provided. Encouraged to drink extra water today and reasoning explained. Verbalized understanding. All questions answered. ABC intact. No further needs. Discharge from procedure area w/o issues.

## 2023-03-22 ENCOUNTER — Other Ambulatory Visit: Payer: Self-pay | Admitting: Infectious Diseases

## 2023-03-22 DIAGNOSIS — Z1231 Encounter for screening mammogram for malignant neoplasm of breast: Secondary | ICD-10-CM

## 2023-03-30 ENCOUNTER — Ambulatory Visit
Admission: RE | Admit: 2023-03-30 | Discharge: 2023-03-30 | Disposition: A | Discharge status: Home / Self Care | Payer: Medicare HMO | Source: Ambulatory Visit | Attending: Infectious Diseases | Admitting: Infectious Diseases

## 2023-03-30 DIAGNOSIS — Z1231 Encounter for screening mammogram for malignant neoplasm of breast: Secondary | ICD-10-CM | POA: Diagnosis present

## 2023-07-27 ENCOUNTER — Other Ambulatory Visit: Payer: Self-pay | Admitting: Orthopedic Surgery

## 2023-07-27 DIAGNOSIS — M19011 Primary osteoarthritis, right shoulder: Secondary | ICD-10-CM

## 2023-08-06 ENCOUNTER — Encounter: Payer: Self-pay | Admitting: Orthopedic Surgery

## 2023-08-07 ENCOUNTER — Encounter: Payer: Self-pay | Admitting: Orthopedic Surgery

## 2023-08-11 ENCOUNTER — Ambulatory Visit
Admission: RE | Admit: 2023-08-11 | Discharge: 2023-08-11 | Disposition: A | Discharge status: Home / Self Care | Payer: Medicare HMO | Source: Ambulatory Visit | Attending: Orthopedic Surgery | Admitting: Orthopedic Surgery

## 2023-08-11 DIAGNOSIS — M19011 Primary osteoarthritis, right shoulder: Secondary | ICD-10-CM

## 2024-01-03 ENCOUNTER — Ambulatory Visit: Admit: 2024-01-03 | Admitting: Surgery

## 2024-01-03 SURGERY — ARTHROPLASTY, SHOULDER, TOTAL, REVERSE
Anesthesia: Choice | Site: Shoulder | Laterality: Right

## 2024-05-21 ENCOUNTER — Ambulatory Visit

## 2024-05-21 DIAGNOSIS — L814 Other melanin hyperpigmentation: Secondary | ICD-10-CM | POA: Diagnosis not present

## 2024-05-21 DIAGNOSIS — L720 Epidermal cyst: Secondary | ICD-10-CM

## 2024-05-21 DIAGNOSIS — W908XXA Exposure to other nonionizing radiation, initial encounter: Secondary | ICD-10-CM

## 2024-05-21 DIAGNOSIS — D1801 Hemangioma of skin and subcutaneous tissue: Secondary | ICD-10-CM

## 2024-05-21 DIAGNOSIS — L820 Inflamed seborrheic keratosis: Secondary | ICD-10-CM | POA: Diagnosis not present

## 2024-05-21 DIAGNOSIS — L821 Other seborrheic keratosis: Secondary | ICD-10-CM | POA: Diagnosis not present

## 2024-05-21 DIAGNOSIS — L578 Other skin changes due to chronic exposure to nonionizing radiation: Secondary | ICD-10-CM

## 2024-05-21 DIAGNOSIS — D229 Melanocytic nevi, unspecified: Secondary | ICD-10-CM

## 2024-05-21 NOTE — Progress Notes (Signed)
 New Patient Visit   Subjective  Marie Foley is a 68 y.o. female who presents for the following: White papules of the L eye x 4, present for a couple of months, irritating, growing larger, didn't start until she started using a certain mascara, pt would like to discuss treatment.  The following portions of the chart were reviewed this encounter and updated as appropriate: medications, allergies, medical history  Review of Systems:  No other skin or systemic complaints except as noted in HPI or Assessment and Plan.  Objective  Well appearing patient in no apparent distress; mood and affect are within normal limits.   A focused examination was performed of the following areas: the face and back, upper extremities   - Multiple 2-3 mm white papules located on the face - L eyelid x 2, L inferior eye x 1 - - Multiple stuck-on brown, tan and grey papillated papules and plaques on the L medial canthus  - 6-20 mm pigmented macules that are tan to brown in color and are somewhat non-uniform in shape and concentrated in the sun-exposed areas - Actinic Elastosis: chronic sun damage: dyspigmentation, telangiectasia, and wrinkling   Relevant exam findings are noted in the Assessment and Plan.  L med canthus x 1 Erythematous stuck-on, waxy papule or plaque  Assessment & Plan      Milia x 3  - Discussed the diagnosis and reassured the pt regarding the benign nature of this condition - Discussed removal - advised may not be covered by insurance. Advised can do self pay. Opted to [employer]  - After discussion of risks, benefits, and alternative, patient opts for cosmetic removal. Discussed that milia may recur.  Milia Extraction Procedure Note After verbal consent was obtained, area was cleaned with an alcohol prep pad. An 11 blade was used to make a small opening in the skin overlying the milia. Pressure was applied with a cotton tip applicator to remove cyst contents. Patient  tolerated procedure well.    If purpura occurs recommend OTC arnica   SEBORRHEIC KERATOSIS - Stuck-on, waxy, tan-brown papules and/or plaques  - Benign-appearing - Discussed benign etiology and prognosis. - Observe - Call for any changes  Diffuse actinic skin damage   - Evidence of actinic skin damage on exam today. This is a chronic with expected duration over 1 year. A portion of actinic keratoses will progress to squamous cell carcinoma of the skin. It is not possible to reliably predict which spots will progress to skin cancer and so treatment is recommended to prevent development of skin cancer. - Reviewed sun avoidance practices, including protective clothing and hat, midday sun avoidance, sunscreen SPF>30 applications Q2-3H when in sun especially after sweating - Reviewed signs/symptoms of skin cancer, patient asked come in sooner if these appear. - Discussed treatment options - After discusison of options for treatment including risks, benefits and alternatives, the patient elected to treat with a course of Efudex to be applied to affected areas  - Encouraged FBSE   INFLAMED SEBORRHEIC KERATOSIS L med canthus x 1 Symptomatic, irritating, patient would like treated.  Destruction of lesion - L med canthus x 1 Complexity: simple   Destruction method: cryotherapy   Informed consent: discussed and consent obtained   Timeout:  patient name, date of birth, surgical site, and procedure verified Lesion destroyed using liquid nitrogen: Yes   Region frozen until ice ball extended beyond lesion: Yes   Cryo cycles: 1 or 2. Outcome: patient tolerated procedure well  with no complications   Post-procedure details: wound care instructions given     HEMANGIOMA Exam: red papule(s) on the back Discussed benign nature. Recommend observation. Call for changes.  Return if symptoms worsen or fail to improve.  [name] [name], CMA, am acting as scribe for Lauraine JAYSON Kanaris, MD  .   Documentation: I have reviewed the above documentation for accuracy and completeness, and I agree with the above.  Lauraine JAYSON Kanaris, MD

## 2024-05-21 NOTE — Patient Instructions (Addendum)
 Seborrheic Keratosis  What causes seborrheic keratoses? Seborrheic keratoses are harmless, common skin growths that first appear during adult life.  As time goes by, more growths appear.  Some people may develop a large number of them.  Seborrheic keratoses appear on both covered and uncovered body parts.  They are not caused by sunlight.  The tendency to develop seborrheic keratoses can be inherited.  They vary in color from skin-colored to gray, brown, or even black.  They can be either smooth or have a rough, warty surface.   Seborrheic keratoses are superficial and look as if they were stuck on the skin.  Under the microscope this type of keratosis looks like layers upon layers of skin.  That is why at times the top layer may seem to fall off, but the rest of the growth remains and re-grows.    Treatment Seborrheic keratoses do not need to be treated, but can easily be removed in the office.  Seborrheic keratoses often cause symptoms when they rub on clothing or jewelry.  Lesions can be in the way of shaving.  If they become inflamed, they can cause itching, soreness, or burning.  Removal of a seborrheic keratosis can be accomplished by freezing, burning, or surgery. If any spot bleeds, scabs, or grows rapidly, please return to have it checked, as these can be an indication of a skin cancer.  Milia  - Discussed the diagnosis and reassured the pt regarding the benign nature of this condition - Extraction may be performed as a cosmetic procedure - After discussion of risks, benefits, and alternative, patient opts for cosmetic removal. Discussed that milia may recur.  Milia Extraction Procedure Note After verbal consent was obtained, area was cleaned with an alcohol prep pad. An 11 blade was used to make a small opening in the skin overlying the milia. Pressure was applied with cotton tip applicators to remove cyst contents.   Due to recent changes in healthcare laws, you may see results of your  pathology and/or laboratory studies on MyChart before the doctors have had a chance to review them. We understand that in some cases there may be results that are confusing or concerning to you. Please understand that not all results are received at the same time and often the doctors may need to interpret multiple results in order to provide you with the best plan of care or course of treatment. Therefore, we ask that you please give us  2 business days to thoroughly review all your results before contacting the office for clarification. Should we see a critical lab result, you will be contacted sooner.   If You Need Anything After Your Visit  If you have any questions or concerns for your doctor, please call our main line at [phone] and press option 4 to reach your doctor's medical assistant. If no one answers, please leave a voicemail as directed and we will return your call as soon as possible. Messages left after 4 pm will be answered the following business day.   You may also send us  a message via MyChart. We typically respond to MyChart messages within 1-2 business days.  For prescription refills, please ask your pharmacy to contact our office. Our fax number is [phone].  If you have an urgent issue when the clinic is closed that cannot wait until the next business day, you can page your doctor at the number below.    Please note that while we do our best to be available for urgent  issues outside of office hours, we are not available 24/7.   If you have an urgent issue and are unable to reach us , you may choose to seek medical care at your doctor's office, retail clinic, urgent care center, or emergency room.  If you have a medical emergency, please immediately call 911 or go to the emergency department.  Pager Numbers  - Dr. Hester: [phone]  - Dr. Jackquline: [phone]  - Dr. Claudene: [phone]   - Dr. Raymund: [phone]  In the event of inclement weather,  please call our main line at [phone] for an update on the status of any delays or closures.  Dermatology Medication Tips: Please keep the boxes that topical medications come in in order to help keep track of the instructions about where and how to use these. Pharmacies typically print the medication instructions only on the boxes and not directly on the medication tubes.   If your medication is too expensive, please contact our office at [phone] option 4 or send us  a message through MyChart.   We are unable to tell what your co-pay for medications will be in advance as this is different depending on your insurance coverage. However, we may be able to find a substitute medication at lower cost or fill out paperwork to get insurance to cover a needed medication.   If a prior authorization is required to get your medication covered by your insurance company, please allow us  1-2 business days to complete this process.  Drug prices often vary depending on where the prescription is filled and some pharmacies may offer cheaper prices.  The website www.goodrx.com contains coupons for medications through different pharmacies. The prices here do not account for what the cost may be with help from insurance (it may be cheaper with your insurance), but the website can give you the price if you did not use any insurance.  - You can print the associated coupon and take it with your prescription to the pharmacy.  - You may also stop by our office during regular business hours and pick up a GoodRx coupon card.  - If you need your prescription sent electronically to a different pharmacy, notify our office through [facility] or by phone at [phone] option 4.     Si Usted Necesita Algo Despus de Su Visita  Tambin puede enviarnos un mensaje a travs de Clinical cytogeneticist. Por lo general respondemos a los mensajes de MyChart en el transcurso de 1 a 2 das hbiles.  Para renovar recetas, por favor  pida a su farmacia que se ponga en contacto con nuestra oficina. Randi lakes de fax es [place] [phone].  Si tiene un asunto urgente cuando la clnica est cerrada y que no puede esperar hasta el siguiente da hbil, puede llamar/localizar a su doctor(a) al nmero que aparece a continuacin.   Por favor, tenga en cuenta que aunque hacemos todo lo posible para estar disponibles para asuntos urgentes fuera del horario de [place], no estamos disponibles las 24 horas del da, los 7 [address]  [address] de la [place].   Si tiene un problema urgente y no puede comunicarse con nosotros, puede optar por buscar atencin mdica  en el consultorio de su doctor(a), en una clnica privada, en un centro de atencin urgente o en una sala de emergencias.  Si tiene Engineer, drilling, por favor llame inmediatamente al 911 o vaya a la sala de emergencias.  Nmeros de bper  - Dr. Hester: [phone]  - Dra. Jackquline: [phone]  -  Dr. Claudene: [phone]  - Dra. Kitts: [phone]  En caso de inclemencias del [place], por favor llame a nuestra lnea principal al [phone] para una actualizacin sobre el estado de cualquier retraso o cierre.  Consejos para la medicacin en dermatologa: Por favor, guarde las cajas en las que vienen los medicamentos de uso tpico para ayudarle a seguir las instrucciones sobre dnde y cmo usarlos. Las farmacias generalmente imprimen las instrucciones del medicamento slo en las cajas y no directamente en los tubos del [place].   Si su medicamento es muy caro, por favor, pngase en contacto con landry rieger llamando al [phone] y presione la opcin 4 o envenos un mensaje a travs de Clinical cytogeneticist.   No podemos decirle cul ser su copago por los medicamentos por adelantado ya que esto es diferente dependiendo de la cobertura de su seguro. Sin embargo, es posible que podamos encontrar un medicamento sustituto a Audiological scientist un formulario para que el seguro cubra el  medicamento que se considera necesario.   Si se requiere una autorizacin previa para que su compaa de seguros malta su medicamento, por favor permtanos de 1 a 2 das hbiles para completar este proceso.  Los precios de los medicamentos varan con frecuencia dependiendo del Environmental consultant de dnde se surte la receta y alguna farmacias pueden ofrecer precios ms baratos.  El sitio web www.goodrx.com tiene cupones para medicamentos de Health and safety inspector. Los precios aqu no tienen en cuenta lo que podra costar con la ayuda del seguro (puede ser ms barato con su seguro), pero el sitio web puede darle el precio si no utiliz Tourist information centre manager.  - Puede imprimir el cupn correspondiente y llevarlo con su receta a la farmacia.  - Tambin puede pasar por nuestra oficina durante el horario de atencin regular y Education officer, museum una tarjeta de cupones de GoodRx.  - Si necesita que su receta se enve electrnicamente a una farmacia diferente, informe a nuestra oficina a travs de MyChart de [place] o por telfono llamando al [phone] y presione la opcin 4.

## 2024-05-21 NOTE — Addendum Note (Signed)
 Addended by: RAYMUND LAURAINE BROCKS on: 05/21/2024 02:35 PM   Modules accepted: Level of Service

## 2024-05-22 ENCOUNTER — Telehealth: Payer: Self-pay

## 2024-05-22 NOTE — Telephone Encounter (Signed)
 Patient called she concerned [facility]

## 2024-05-22 NOTE — Telephone Encounter (Signed)
 Patient called she is concerned milia around her eyes she had treated yesterday are not gone, discussed with patient she should wait at least 4-6 weeks and if not gone return to the office
